# Patient Record
Sex: Female | Born: 1970 | Race: Black or African American | Hispanic: No | Smoking: Never smoker
Health system: Southern US, Community
[De-identification: ages and names within clinical notes are randomized; demographics above are authoritative.]

## PROBLEM LIST (undated history)

## (undated) DIAGNOSIS — I10 Essential (primary) hypertension: Secondary | ICD-10-CM

## (undated) DIAGNOSIS — E669 Obesity, unspecified: Secondary | ICD-10-CM

## (undated) DIAGNOSIS — M1990 Unspecified osteoarthritis, unspecified site: Secondary | ICD-10-CM

## (undated) HISTORY — DX: Unspecified osteoarthritis, unspecified site: M19.90

## (undated) HISTORY — DX: Obesity, unspecified: E66.9

## (undated) HISTORY — DX: Essential (primary) hypertension: I10

## (undated) HISTORY — PX: TUBAL LIGATION: SHX77

---

## 1997-10-09 ENCOUNTER — Encounter: Admission: RE | Admit: 1997-10-09 | Discharge: 1997-10-09 | Payer: Self-pay | Admitting: Sports Medicine

## 1998-01-31 ENCOUNTER — Encounter: Admission: RE | Admit: 1998-01-31 | Discharge: 1998-01-31 | Payer: Self-pay | Admitting: Family Medicine

## 1999-03-08 ENCOUNTER — Emergency Department (HOSPITAL_COMMUNITY): Admission: EM | Admit: 1999-03-08 | Discharge: 1999-03-08 | Payer: Self-pay | Admitting: Emergency Medicine

## 1999-03-29 ENCOUNTER — Emergency Department (HOSPITAL_COMMUNITY): Admission: EM | Admit: 1999-03-29 | Discharge: 1999-03-29 | Payer: Self-pay | Admitting: Emergency Medicine

## 1999-08-20 ENCOUNTER — Encounter: Payer: Self-pay | Admitting: Emergency Medicine

## 1999-08-20 ENCOUNTER — Emergency Department (HOSPITAL_COMMUNITY): Admission: EM | Admit: 1999-08-20 | Discharge: 1999-08-20 | Payer: Self-pay | Admitting: Emergency Medicine

## 2000-04-15 ENCOUNTER — Emergency Department (HOSPITAL_COMMUNITY): Admission: EM | Admit: 2000-04-15 | Discharge: 2000-04-15 | Payer: Self-pay | Admitting: Emergency Medicine

## 2000-10-07 ENCOUNTER — Emergency Department (HOSPITAL_COMMUNITY): Admission: EM | Admit: 2000-10-07 | Discharge: 2000-10-07 | Payer: Self-pay | Admitting: Emergency Medicine

## 2000-11-04 ENCOUNTER — Emergency Department (HOSPITAL_COMMUNITY): Admission: EM | Admit: 2000-11-04 | Discharge: 2000-11-04 | Payer: Self-pay | Admitting: Emergency Medicine

## 2000-12-24 ENCOUNTER — Encounter: Admission: RE | Admit: 2000-12-24 | Discharge: 2000-12-24 | Payer: Self-pay | Admitting: Family Medicine

## 2000-12-31 ENCOUNTER — Encounter: Admission: RE | Admit: 2000-12-31 | Discharge: 2000-12-31 | Payer: Self-pay | Admitting: Family Medicine

## 2001-01-31 ENCOUNTER — Encounter: Admission: RE | Admit: 2001-01-31 | Discharge: 2001-01-31 | Payer: Self-pay | Admitting: Family Medicine

## 2001-02-23 ENCOUNTER — Encounter: Admission: RE | Admit: 2001-02-23 | Discharge: 2001-02-23 | Payer: Self-pay | Admitting: Family Medicine

## 2003-12-14 ENCOUNTER — Emergency Department (HOSPITAL_COMMUNITY): Admission: EM | Admit: 2003-12-14 | Discharge: 2003-12-14 | Payer: Self-pay | Admitting: Family Medicine

## 2005-10-15 ENCOUNTER — Encounter: Admission: RE | Admit: 2005-10-15 | Discharge: 2005-10-15 | Payer: Self-pay | Admitting: Sports Medicine

## 2006-02-20 ENCOUNTER — Emergency Department (HOSPITAL_COMMUNITY): Admission: EM | Admit: 2006-02-20 | Discharge: 2006-02-20 | Payer: Self-pay | Admitting: Emergency Medicine

## 2006-03-06 ENCOUNTER — Encounter (INDEPENDENT_AMBULATORY_CARE_PROVIDER_SITE_OTHER): Payer: Self-pay | Admitting: *Deleted

## 2006-03-06 LAB — CONVERTED CEMR LAB

## 2006-03-12 ENCOUNTER — Other Ambulatory Visit: Admission: RE | Admit: 2006-03-12 | Discharge: 2006-03-12 | Payer: Self-pay | Admitting: Family Medicine

## 2006-03-12 ENCOUNTER — Ambulatory Visit: Payer: Self-pay | Admitting: Family Medicine

## 2006-03-12 ENCOUNTER — Encounter (INDEPENDENT_AMBULATORY_CARE_PROVIDER_SITE_OTHER): Payer: Self-pay | Admitting: *Deleted

## 2006-03-25 ENCOUNTER — Emergency Department (HOSPITAL_COMMUNITY): Admission: EM | Admit: 2006-03-25 | Discharge: 2006-03-25 | Payer: Self-pay | Admitting: Family Medicine

## 2006-03-29 ENCOUNTER — Emergency Department (HOSPITAL_COMMUNITY): Admission: EM | Admit: 2006-03-29 | Discharge: 2006-03-30 | Payer: Self-pay | Admitting: Emergency Medicine

## 2006-04-02 ENCOUNTER — Ambulatory Visit: Payer: Self-pay | Admitting: Family Medicine

## 2006-04-07 ENCOUNTER — Ambulatory Visit (HOSPITAL_COMMUNITY): Admission: RE | Admit: 2006-04-07 | Discharge: 2006-04-07 | Payer: Self-pay | Admitting: *Deleted

## 2006-04-08 ENCOUNTER — Ambulatory Visit: Payer: Self-pay | Admitting: Family Medicine

## 2006-07-06 ENCOUNTER — Emergency Department (HOSPITAL_COMMUNITY): Admission: EM | Admit: 2006-07-06 | Discharge: 2006-07-06 | Payer: Self-pay | Admitting: Emergency Medicine

## 2006-09-02 DIAGNOSIS — E669 Obesity, unspecified: Secondary | ICD-10-CM

## 2006-09-02 DIAGNOSIS — J309 Allergic rhinitis, unspecified: Secondary | ICD-10-CM | POA: Insufficient documentation

## 2006-09-03 ENCOUNTER — Encounter (INDEPENDENT_AMBULATORY_CARE_PROVIDER_SITE_OTHER): Payer: Self-pay | Admitting: *Deleted

## 2006-10-12 ENCOUNTER — Emergency Department (HOSPITAL_COMMUNITY): Admission: EM | Admit: 2006-10-12 | Discharge: 2006-10-12 | Payer: Self-pay | Admitting: Emergency Medicine

## 2006-12-08 ENCOUNTER — Emergency Department (HOSPITAL_COMMUNITY): Admission: EM | Admit: 2006-12-08 | Discharge: 2006-12-08 | Payer: Self-pay | Admitting: Family Medicine

## 2007-09-07 ENCOUNTER — Emergency Department (HOSPITAL_COMMUNITY): Admission: EM | Admit: 2007-09-07 | Discharge: 2007-09-07 | Payer: Self-pay | Admitting: Family Medicine

## 2007-10-22 ENCOUNTER — Emergency Department (HOSPITAL_COMMUNITY): Admission: EM | Admit: 2007-10-22 | Discharge: 2007-10-22 | Payer: Self-pay | Admitting: Emergency Medicine

## 2009-08-23 ENCOUNTER — Emergency Department (HOSPITAL_COMMUNITY): Admission: EM | Admit: 2009-08-23 | Discharge: 2009-08-23 | Payer: Self-pay | Admitting: Emergency Medicine

## 2010-03-12 ENCOUNTER — Emergency Department (HOSPITAL_COMMUNITY): Admission: EM | Admit: 2010-03-12 | Discharge: 2010-03-12 | Payer: Self-pay | Admitting: Emergency Medicine

## 2010-09-18 LAB — DIFFERENTIAL
Basophils Absolute: 0 10*3/uL (ref 0.0–0.1)
Eosinophils Absolute: 0.2 10*3/uL (ref 0.0–0.7)
Eosinophils Relative: 2 % (ref 0–5)
Monocytes Absolute: 0.6 10*3/uL (ref 0.1–1.0)

## 2010-09-18 LAB — BASIC METABOLIC PANEL
BUN: 14 mg/dL (ref 6–23)
CO2: 27 mEq/L (ref 19–32)
Chloride: 108 mEq/L (ref 96–112)
Creatinine, Ser: 1.01 mg/dL (ref 0.4–1.2)
Glucose, Bld: 90 mg/dL (ref 70–99)

## 2010-09-18 LAB — CBC
HCT: 40.1 % (ref 36.0–46.0)
MCH: 27.8 pg (ref 26.0–34.0)
MCHC: 33.4 g/dL (ref 30.0–36.0)
MCV: 83.2 fL (ref 78.0–100.0)
Platelets: 283 10*3/uL (ref 150–400)
RDW: 14.4 % (ref 11.5–15.5)

## 2010-09-18 LAB — D-DIMER, QUANTITATIVE: D-Dimer, Quant: <0.22 ug/mL-FEU (ref 0.00–0.48)

## 2011-04-02 ENCOUNTER — Emergency Department (HOSPITAL_COMMUNITY): Payer: Self-pay

## 2011-04-02 ENCOUNTER — Emergency Department (HOSPITAL_COMMUNITY)
Admission: EM | Admit: 2011-04-02 | Discharge: 2011-04-02 | Disposition: A | Discharge status: Home / Self Care | Payer: Self-pay | Attending: Emergency Medicine | Admitting: Emergency Medicine

## 2011-04-02 DIAGNOSIS — R42 Dizziness and giddiness: Secondary | ICD-10-CM | POA: Insufficient documentation

## 2011-04-02 DIAGNOSIS — M79609 Pain in unspecified limb: Secondary | ICD-10-CM | POA: Insufficient documentation

## 2011-04-02 DIAGNOSIS — R55 Syncope and collapse: Secondary | ICD-10-CM | POA: Insufficient documentation

## 2011-04-02 DIAGNOSIS — R110 Nausea: Secondary | ICD-10-CM | POA: Insufficient documentation

## 2011-04-02 LAB — COMPREHENSIVE METABOLIC PANEL
Albumin: 3.8 g/dL (ref 3.5–5.2)
BUN: 15 mg/dL (ref 6–23)
Creatinine, Ser: 0.71 mg/dL (ref 0.50–1.10)
Total Protein: 7.3 g/dL (ref 6.0–8.3)

## 2011-04-02 LAB — CBC
HCT: 39.8 % (ref 36.0–46.0)
MCHC: 33.2 g/dL (ref 30.0–36.0)
MCV: 81.7 fL (ref 78.0–100.0)
RDW: 14.5 % (ref 11.5–15.5)

## 2011-04-02 LAB — POCT I-STAT TROPONIN I

## 2013-04-12 ENCOUNTER — Encounter (HOSPITAL_COMMUNITY): Payer: Self-pay | Admitting: Emergency Medicine

## 2013-04-12 ENCOUNTER — Emergency Department (HOSPITAL_COMMUNITY): Payer: Self-pay

## 2013-04-12 ENCOUNTER — Emergency Department (HOSPITAL_COMMUNITY)
Admission: EM | Admit: 2013-04-12 | Discharge: 2013-04-12 | Disposition: A | Discharge status: Home / Self Care | Payer: Self-pay | Attending: Emergency Medicine | Admitting: Emergency Medicine

## 2013-04-12 DIAGNOSIS — J019 Acute sinusitis, unspecified: Secondary | ICD-10-CM | POA: Insufficient documentation

## 2013-04-12 DIAGNOSIS — G500 Trigeminal neuralgia: Secondary | ICD-10-CM | POA: Insufficient documentation

## 2013-04-12 DIAGNOSIS — J069 Acute upper respiratory infection, unspecified: Secondary | ICD-10-CM | POA: Insufficient documentation

## 2013-04-12 LAB — CBC WITH DIFFERENTIAL/PLATELET
Eosinophils Relative: 2 % (ref 0–5)
HCT: 40.1 % (ref 36.0–46.0)
Hemoglobin: 13.3 g/dL (ref 12.0–15.0)
Lymphocytes Relative: 28 % (ref 12–46)
Lymphs Abs: 2.7 10*3/uL (ref 0.7–4.0)
MCV: 83.4 fL (ref 78.0–100.0)
Monocytes Absolute: 0.7 10*3/uL (ref 0.1–1.0)
RBC: 4.81 MIL/uL (ref 3.87–5.11)
WBC: 9.5 10*3/uL (ref 4.0–10.5)

## 2013-04-12 LAB — COMPREHENSIVE METABOLIC PANEL
ALT: 12 U/L (ref 0–35)
CO2: 26 mEq/L (ref 19–32)
Calcium: 9.4 mg/dL (ref 8.4–10.5)
GFR calc Af Amer: 83 mL/min — ABNORMAL LOW (ref >90)
GFR calc non Af Amer: 71 mL/min — ABNORMAL LOW (ref >90)
Glucose, Bld: 78 mg/dL (ref 70–99)
Sodium: 143 mEq/L (ref 135–145)

## 2013-04-12 MED ORDER — ALBUTEROL SULFATE HFA 108 (90 BASE) MCG/ACT IN AERS: 1.0000 | INHALATION_SPRAY | Freq: Four times a day (QID) | RESPIRATORY_TRACT | Status: DC | PRN

## 2013-04-12 MED ORDER — ALBUTEROL SULFATE (5 MG/ML) 0.5% IN NEBU
5.0000 mg | INHALATION_SOLUTION | Freq: Once | RESPIRATORY_TRACT | Status: AC
  Administered 2013-04-12: 5 mg via RESPIRATORY_TRACT
  Filled 2013-04-12: qty 1

## 2013-04-12 MED ORDER — AZITHROMYCIN 250 MG PO TABS: 250.0000 mg | ORAL_TABLET | Freq: Every day | ORAL | Status: DC

## 2013-04-12 MED ORDER — ASPIRIN 81 MG PO CHEW
324.0000 mg | CHEWABLE_TABLET | Freq: Once | ORAL | Status: AC
  Administered 2013-04-12: 324 mg via ORAL
  Filled 2013-04-12: qty 4

## 2013-04-12 NOTE — ED Notes (Signed)
Per pt sts since yesterday she has sharp pains in her head. sts about 1 hour ago she began having SOB and lightheaded. Denies N,V.

## 2013-04-12 NOTE — ED Provider Notes (Signed)
CSN: [id number]     Arrival date & time 04/12/13  1337 History   First MD Initiated Contact with Patient 04/12/13 1612     Chief Complaint  Patient presents with  . Headache   (Consider location/radiation/quality/duration/timing/severity/associated sxs/prior Treatment) HPI Comments: Patient presents with a chief complaint of headache and shortness of breath.  She reports that the headache has been occurring intermittently over the past 1.5 weeks.  She describes the pain as a sharp shooting pain of the right side of her face.  She reports that the pain only last for a second and then resolves without intervention.  Patient denies any nausea, vomiting, vision changes, neck pain/stiffness, fever or chills.  She states that she has never had headaches like this before.  Denies headache at this time.    Patient also reports that she began feeling short of breath around noon today.  Shortness of breath associated with a dry cough.  She also reports that she has had some tightness in her chest.  She denies chest pain.  She reports that the tightness is across her chest and does not radiate.  She has also had some associated frontal sinus pressure for the past 2 weeks.  She denies any history of Asthma or COPD.  She does not smoke.   No history of PE or DVT.  No cardiac history.  No recent prolonged travel or surgeries in the past 4 weeks.  Denies lower extremity edema or pain.   She is currently not on any oral contraceptives.    The history is provided by the patient.    History reviewed. No pertinent past medical history. Past Surgical History  Procedure Laterality Date  . Tubal ligation     History reviewed. No pertinent family history. History  Substance Use Topics  . Smoking status: Never Smoker   . Smokeless tobacco: Not on file  . Alcohol Use: No   OB History   Grav Para Term Preterm Abortions TAB SAB Ect Mult Living                 Review of Systems  HENT: Positive for sinus  pressure.   Respiratory: Positive for chest tightness and shortness of breath.   Neurological: Positive for headaches.  All other systems reviewed and are negative.    Allergies  Review of patient's allergies indicates no known allergies.  Home Medications   Current Outpatient Rx  Name  Route  Sig  Dispense  Refill  . ibuprofen (ADVIL,MOTRIN) 200 MG tablet   Oral   Take 400 mg by mouth every 6 (six) hours as needed for pain.          BP 120/78  Pulse 57  Temp(Src) 97.9 F (36.6 C)  Resp 18  Ht 5\' 5"  (1.651 m)  Wt 313 lb (141.976 kg)  BMI 52.09 kg/m2  SpO2 100%  LMP 03/29/2013 Physical Exam  Nursing note and vitals reviewed. Constitutional: She is oriented to person, place, and time. She appears well-developed and well-nourished. No distress.  HENT:  Head: Normocephalic and atraumatic.  Nose: Right sinus exhibits frontal sinus tenderness. Left sinus exhibits frontal sinus tenderness.  Mouth/Throat: Oropharynx is clear and moist.  Eyes: EOM are normal. Pupils are equal, round, and reactive to light.  Neck: Normal range of motion. Neck supple.  Cardiovascular: Normal rate, regular rhythm and normal heart sounds.   Pulmonary/Chest: Effort normal and breath sounds normal. No respiratory distress. She has no wheezes. She has no rales.  Musculoskeletal: Normal range of motion.  Neurological: She is alert and oriented to person, place, and time. She has normal strength. No cranial nerve deficit or sensory deficit. She displays a negative Romberg sign. Coordination and gait normal.  Normal finger to nose testing Normal rapid alternating movements.  Skin: Skin is warm and dry. No rash noted. She is not diaphoretic.  Psychiatric: She has a normal mood and affect.    ED Course  Procedures (including critical care time) Labs Review Labs Reviewed  CBC WITH DIFFERENTIAL  COMPREHENSIVE METABOLIC PANEL   Imaging Review Dg Chest 2 View  04/12/2013   CLINICAL DATA:  Shortness  of breath.  EXAM: CHEST  2 VIEW  COMPARISON:  Multiple priors  FINDINGS: The cardiomediastinal silhouette is unchanged. No focal infiltrate or edema. No pleural effusion or pneumothorax. No acute osseous abnormality.  IMPRESSION: No active cardiopulmonary disease.   Electronically Signed   By: Jerene Dilling M.D.   On: 04/12/2013 17:40    6:08 PM Reassessed patient.  She reports that the SOB and chest tightness has improved after getting the Albuterol neb.  MDM  No diagnosis found. Patient presents today with two separate complaints.  She reports that she has had a headache and also SOB.  Headache most consistent with trigeminal neuralgia.  Patient with normal neurological exam.  No red flags of headache.  Patient also complaining of SOB.  No ischemic changes on EKG.  Troponin negative.  CXR negative.  Pulse ox 97-100 on RA.  No signs of respiratory distress.  Symptoms improved after given Albuterol neb.  Symptoms most consistent with Viral URI.  However, patient also with sinus pressure.  Will treat with antibiotic for sinus infection.  Patient is stable for discharge.  Return precautions given.    Pascal Lux [place], PA-C 04/13/13 1212

## 2013-04-12 NOTE — ED Notes (Signed)
Returned from radiology. 

## 2013-04-14 NOTE — ED Provider Notes (Signed)
Medical screening examination/treatment/procedure(s) were performed by non-physician practitioner and as supervising physician I was immediately available for consultation/collaboration.   Axxel Gude B. Marcelle Bebout, MD 04/14/13 2028 

## 2013-10-25 ENCOUNTER — Encounter (HOSPITAL_COMMUNITY): Payer: Self-pay | Admitting: Emergency Medicine

## 2013-10-25 ENCOUNTER — Emergency Department (HOSPITAL_COMMUNITY)
Admission: EM | Admit: 2013-10-25 | Discharge: 2013-10-25 | Disposition: A | Discharge status: Home / Self Care | Payer: Self-pay | Attending: Emergency Medicine | Admitting: Emergency Medicine

## 2013-10-25 ENCOUNTER — Emergency Department (HOSPITAL_COMMUNITY): Payer: Self-pay

## 2013-10-25 DIAGNOSIS — R000 Tachycardia, unspecified: Secondary | ICD-10-CM | POA: Insufficient documentation

## 2013-10-25 DIAGNOSIS — J329 Chronic sinusitis, unspecified: Secondary | ICD-10-CM

## 2013-10-25 DIAGNOSIS — J029 Acute pharyngitis, unspecified: Secondary | ICD-10-CM | POA: Insufficient documentation

## 2013-10-25 MED ORDER — AZITHROMYCIN 250 MG PO TABS: 250.0000 mg | ORAL_TABLET | Freq: Every day | ORAL | Status: DC

## 2013-10-25 MED ORDER — DEXTROMETHORPHAN POLISTIREX 30 MG/5ML PO LQCR: 30.0000 mg | ORAL | Status: DC | PRN

## 2013-10-25 NOTE — ED Provider Notes (Signed)
CSN: [id number]     Arrival date & time 10/25/13  8938 History  This chart was scribed for non-physician practitioner working with Diane Chen, by Allena Earing ED Scribe. This patient was seen in [facility] and the patient's care was started at 11:05 AM.    Chief Complaint  Patient presents with  . URI      Patient is a 43 y.o. female presenting with URI. The history is provided by the patient. No language interpreter was used.  URI Presenting symptoms: congestion, cough and sore throat    HPI Comments: Diane Chen is a 43 y.o. female who presents to the Emergency Department complaining of a sore that began on Sunday and has since worsened. Pt reports associated congestion, sinus pressure, cough, and fever. Her cough produces a thick,greenish sputum. The thickness of the sputum has the pt worried. She reports no h/o sinus problems.   History reviewed. No pertinent past medical history. Past Surgical History  Procedure Laterality Date  . Tubal ligation     History reviewed. No pertinent family history. History  Substance Use Topics  . Smoking status: Never Smoker   . Smokeless tobacco: Not on file  . Alcohol Use: No   OB History   Grav Para Term Preterm Abortions TAB SAB Ect Mult Living                 Review of Systems  HENT: Positive for congestion and sore throat.   Respiratory: Positive for cough.   All other systems reviewed and are negative.     Allergies  Review of patient's allergies indicates no known allergies.  Home Medications   Prior to Admission medications   Medication Sig Start Date End Date Taking? Authorizing Provider  albuterol (PROVENTIL HFA;VENTOLIN HFA) 108 (90 BASE) MCG/ACT inhaler Inhale 1-2 puffs into the lungs every 6 (six) hours as needed for wheezing. 04/12/13   Heather Laisure, PA-C  azithromycin (ZITHROMAX) 250 MG tablet Take 1 tablet (250 mg total) by mouth daily. Take first 2 tablets together, then 1 every day until  finished. 04/12/13   Heather Laisure, PA-C  ibuprofen (ADVIL,MOTRIN) 200 MG tablet Take 400 mg by mouth every 6 (six) hours as needed for pain.    Historical Provider, MD   BP 98/66  Pulse 117  Temp(Src) 97.8 F (36.6 C) (Oral)  Resp 16  Wt 305 lb 6 oz (138.517 kg)  SpO2 99% Physical Exam  Nursing note and vitals reviewed. Constitutional: She is oriented to person, place, and time. She appears well-developed and well-nourished. No distress.  HENT:  Head: Normocephalic and atraumatic.  Maxillary sinus tenderness to palpation  Eyes: Conjunctivae are normal. Pupils are equal, round, and reactive to light.  Neck: Normal range of motion.  Cardiovascular: Regular rhythm.  Exam reveals no gallop and no friction rub.   No murmur heard. tachycardic  Pulmonary/Chest: Effort normal and breath sounds normal. She has no wheezes. She has no rales. She exhibits no tenderness.  Abdominal: Soft. There is no tenderness.  Musculoskeletal: Normal range of motion.  Neurological: She is alert and oriented to person, place, and time.  Speech is goal-oriented. Moves limbs without ataxia.   Skin: Skin is warm and dry.    ED Course  Procedures (including critical care time)  DIAGNOSTIC STUDIES: Oxygen Saturation is 99% on RA, normalzxc by my interpretation.    COORDINATION OF CARE:   11:09 AM-Discussed treatment plan which includes antibiotics and cough medicationwith pt at bedside and pt agreed  to plan.   Labs Review Labs Reviewed - No data to display  Imaging Review Dg Chest 2 View  10/25/2013   CLINICAL DATA:  Cough, congestion, fever  EXAM: CHEST  2 VIEW  COMPARISON:  04/12/2013  FINDINGS: Cardiomediastinal silhouette is unremarkable. No acute infiltrate or pleural effusion. No pulmonary edema. Mild degenerative changes thoracic spine.  IMPRESSION: No active cardiopulmonary disease.   Electronically Signed   By: Lahoma Crocker M.D.   On: 10/25/2013 10:49     EKG Interpretation None      MDM    Final diagnoses:  Sinusitis   I personally performed the services described in this documentation, which was scribed in my presence. The recorded information has been reviewed and is accurate.  11:16 AM Chest xray unremarkable for acute changes. Patient will be treated for sinusitis with azithromycin. Patient will have delsym for cough. Vitals stable and patient afebrile. Patient tachycardic due to cough and congestion.     Diane Chou, PA-C 10/25/13 1118

## 2013-10-25 NOTE — ED Provider Notes (Signed)
Medical screening examination/treatment/procedure(s) were performed by non-physician practitioner and as supervising physician I was immediately available for consultation/collaboration.   EKG Interpretation None        Dhruva Orndoff M Redith Drach, DO 10/25/13 1914 

## 2013-10-25 NOTE — Discharge Instructions (Signed)
Take azithromycin as directed until gone. Refer to attached documents for more information. Take delsym as needed for cough. l

## 2013-10-25 NOTE — ED Notes (Signed)
She states "ive had a head and chest cold since Sunday." she c/o cough, congestion.

## 2013-10-25 NOTE — ED Notes (Signed)
States started with sore throat "burning" on Sunday. Developed nasal congestion and productive cough, green sputum. No exudate noted in throat.

## 2014-02-05 ENCOUNTER — Emergency Department (HOSPITAL_COMMUNITY): Payer: Self-pay

## 2014-02-05 ENCOUNTER — Emergency Department (HOSPITAL_COMMUNITY)
Admission: EM | Admit: 2014-02-05 | Discharge: 2014-02-06 | Disposition: A | Discharge status: Home / Self Care | Payer: Self-pay | Attending: Emergency Medicine | Admitting: Emergency Medicine

## 2014-02-05 ENCOUNTER — Encounter (HOSPITAL_COMMUNITY): Payer: Self-pay | Admitting: Emergency Medicine

## 2014-02-05 DIAGNOSIS — R109 Unspecified abdominal pain: Secondary | ICD-10-CM | POA: Insufficient documentation

## 2014-02-05 DIAGNOSIS — Z9851 Tubal ligation status: Secondary | ICD-10-CM | POA: Insufficient documentation

## 2014-02-05 DIAGNOSIS — D259 Leiomyoma of uterus, unspecified: Secondary | ICD-10-CM | POA: Insufficient documentation

## 2014-02-05 DIAGNOSIS — Z3202 Encounter for pregnancy test, result negative: Secondary | ICD-10-CM | POA: Insufficient documentation

## 2014-02-05 LAB — URINALYSIS, ROUTINE W REFLEX MICROSCOPIC
Glucose, UA: NEGATIVE mg/dL
Hgb urine dipstick: NEGATIVE
Ketones, ur: 15 mg/dL — AB
NITRITE: NEGATIVE
PH: 5 (ref 5.0–8.0)
Protein, ur: NEGATIVE mg/dL
SPECIFIC GRAVITY, URINE: 1.03 (ref 1.005–1.030)
UROBILINOGEN UA: 1 mg/dL (ref 0.0–1.0)

## 2014-02-05 LAB — CBC WITH DIFFERENTIAL/PLATELET
BASOS PCT: 0 % (ref 0–1)
Basophils Absolute: 0 10*3/uL (ref 0.0–0.1)
Eosinophils Absolute: 0.1 10*3/uL (ref 0.0–0.7)
Eosinophils Relative: 1 % (ref 0–5)
HEMATOCRIT: 38.8 % (ref 36.0–46.0)
HEMOGLOBIN: 12.7 g/dL (ref 12.0–15.0)
LYMPHS ABS: 2.7 10*3/uL (ref 0.7–4.0)
Lymphocytes Relative: 24 % (ref 12–46)
MCH: 27 pg (ref 26.0–34.0)
MCHC: 32.7 g/dL (ref 30.0–36.0)
MCV: 82.6 fL (ref 78.0–100.0)
MONO ABS: 0.9 10*3/uL (ref 0.1–1.0)
MONOS PCT: 8 % (ref 3–12)
NEUTROS ABS: 7.6 10*3/uL (ref 1.7–7.7)
Neutrophils Relative %: 67 % (ref 43–77)
Platelets: 228 10*3/uL (ref 150–400)
RBC: 4.7 MIL/uL (ref 3.87–5.11)
RDW: 15.1 % (ref 11.5–15.5)
WBC: 11.3 10*3/uL — ABNORMAL HIGH (ref 4.0–10.5)

## 2014-02-05 LAB — BASIC METABOLIC PANEL
Anion gap: 10 (ref 5–15)
BUN: 10 mg/dL (ref 6–23)
CHLORIDE: 107 meq/L (ref 96–112)
CO2: 23 mEq/L (ref 19–32)
CREATININE: 0.91 mg/dL (ref 0.50–1.10)
Calcium: 8.4 mg/dL (ref 8.4–10.5)
GFR calc non Af Amer: 76 mL/min — ABNORMAL LOW (ref >90)
GFR, EST AFRICAN AMERICAN: 88 mL/min — AB (ref >90)
GLUCOSE: 83 mg/dL (ref 70–99)
Potassium: 3.9 mEq/L (ref 3.7–5.3)
Sodium: 140 mEq/L (ref 137–147)

## 2014-02-05 LAB — URINE MICROSCOPIC-ADD ON

## 2014-02-05 LAB — WET PREP, GENITAL
Trich, Wet Prep: NONE SEEN
Yeast Wet Prep HPF POC: NONE SEEN

## 2014-02-05 LAB — POC URINE PREG, ED: PREG TEST UR: NEGATIVE

## 2014-02-05 MED ORDER — IOHEXOL 300 MG/ML  SOLN
100.0000 mL | Freq: Once | INTRAMUSCULAR | Status: AC | PRN
  Administered 2014-02-05: 100 mL via INTRAVENOUS

## 2014-02-05 MED ORDER — OXYCODONE-ACETAMINOPHEN 5-325 MG PO TABS
1.0000 | ORAL_TABLET | Freq: Once | ORAL | Status: AC
  Administered 2014-02-05: 1 via ORAL
  Filled 2014-02-05: qty 1

## 2014-02-05 NOTE — ED Notes (Signed)
Per pt sts onset of RLQ pain that started yesterday. Denies any N,V,D. Denies any vagina bleeding or discharge.

## 2014-02-05 NOTE — ED Provider Notes (Signed)
Place of right lower quadrant pain nonradiating for 2 days. No anorexia. Last bowel movement yesterday or today. Pain much improved after treatment with Percocet in the emergency department on exam no distress abdomen morbidly obese, minimally tender right lower quadrant no guarding rigidity or rebound  Diane Dakin, MD 02/05/14 2046

## 2014-02-05 NOTE — ED Notes (Signed)
CT notified pt completed drinking contrast 

## 2014-02-05 NOTE — ED Notes (Signed)
Pelvic cart set up at bedside- pt undressed from waist down.

## 2014-02-05 NOTE — ED Notes (Signed)
Patient transported to CT 

## 2014-02-05 NOTE — ED Notes (Signed)
CT called about delay

## 2014-02-05 NOTE — ED Provider Notes (Signed)
CSN: [id number]     Arrival date & time 02/05/14  1348 History   None    Chief Complaint  Patient presents with  . Abdominal Pain     (Consider location/radiation/quality/duration/timing/severity/associated sxs/prior Treatment) HPI Comments: Patient presents emergency department with chief complaint of right lower quadrant abdominal pain. She states pain started yesterday. She denies any associated nausea, vomiting, or diarrhea. She states the pain is worsened with palpation. She has not tried taking anything to alleviate her symptoms. She states that her pain as currently mild, but she was given Percocet by triage nurse. She reports mild intermittent dysuria, but states that this is improving. She denies any vaginal discharge or bleeding.  The history is provided by the patient. No language interpreter was used.    History reviewed. No pertinent past medical history. Past Surgical History  Procedure Laterality Date  . Tubal ligation     History reviewed. No pertinent family history. History  Substance Use Topics  . Smoking status: Never Smoker   . Smokeless tobacco: Not on file  . Alcohol Use: No   OB History   Grav Para Term Preterm Abortions TAB SAB Ect Mult Living                 Review of Systems  Constitutional: Negative for fever and chills.  Respiratory: Negative for shortness of breath.   Cardiovascular: Negative for chest pain.  Gastrointestinal: Positive for abdominal pain. Negative for nausea, vomiting, diarrhea and constipation.  Genitourinary: Negative for dysuria.  All other systems reviewed and are negative.     Allergies  Review of patient's allergies indicates no known allergies.  Home Medications   Prior to Admission medications   Medication Sig Start Date End Date Taking? Authorizing Provider  ibuprofen (ADVIL,MOTRIN) 200 MG tablet Take 400 mg by mouth every 6 (six) hours as needed for pain.   Yes Historical Provider, MD   BP 151/95  Pulse 69   Temp(Src) 98.5 F (36.9 C) (Oral)  Resp 18  Ht 5\' 5"  (1.651 m)  Wt 310 lb (140.615 kg)  BMI 51.59 kg/m2  SpO2 100%  LMP 01/22/2014 Physical Exam  Nursing note and vitals reviewed. Constitutional: She is oriented to person, place, and time. She appears well-developed and well-nourished.  HENT:  Head: Normocephalic and atraumatic.  Eyes: Conjunctivae and EOM are normal. Pupils are equal, round, and reactive to light.  Neck: Normal range of motion. Neck supple.  Cardiovascular: Normal rate and regular rhythm.  Exam reveals no gallop and no friction rub.   No murmur heard. Pulmonary/Chest: Effort normal and breath sounds normal. No respiratory distress. She has no wheezes. She has no rales. She exhibits no tenderness.  Abdominal: Soft. Bowel sounds are normal. She exhibits no distension and no mass. There is tenderness. There is no rebound and no guarding.  Right lower quadrant tenderness palpation, no other focal abdominal tenderness, no right upper quadrant tenderness, or Murphy's sign, no left-sided abdominal tenderness  Genitourinary:  Pelvic exam chaperoned by female ER tech, no right or left adnexal tenderness, no uterine tenderness, no vaginal discharge or bleeding, no CMT or friability, no foreign body, no injury to the external genitalia, no other significant findings   Musculoskeletal: Normal range of motion. She exhibits no edema and no tenderness.  Neurological: She is alert and oriented to person, place, and time.  Skin: Skin is warm and dry.  Psychiatric: She has a normal mood and affect. Her behavior is normal. Judgment and thought content  normal.    ED Course  Procedures (including critical care time) Results for orders placed during the hospital encounter of 02/05/14  WET PREP, GENITAL      Result Value Ref Range   Yeast Wet Prep HPF POC NONE SEEN  NONE SEEN   Trich, Wet Prep NONE SEEN  NONE SEEN   Clue Cells Wet Prep HPF POC FEW (*) NONE SEEN   WBC, Wet Prep HPF POC  MANY (*) NONE SEEN  URINALYSIS, ROUTINE W REFLEX MICROSCOPIC      Result Value Ref Range   Color, Urine AMBER (*) YELLOW   APPearance CLOUDY (*) CLEAR   Specific Gravity, Urine 1.030  1.005 - 1.030   pH 5.0  5.0 - 8.0   Glucose, UA NEGATIVE  NEGATIVE mg/dL   Hgb urine dipstick NEGATIVE  NEGATIVE   Bilirubin Urine SMALL (*) NEGATIVE   Ketones, ur 15 (*) NEGATIVE mg/dL   Protein, ur NEGATIVE  NEGATIVE mg/dL   Urobilinogen, UA 1.0  0.0 - 1.0 mg/dL   Nitrite NEGATIVE  NEGATIVE   Leukocytes, UA SMALL (*) NEGATIVE  URINE MICROSCOPIC-ADD ON      Result Value Ref Range   Squamous Epithelial / LPF MANY (*) RARE   WBC, UA 0-2  <3 WBC/hpf   RBC / HPF 0-2  <3 RBC/hpf   Bacteria, UA RARE  RARE  CBC WITH DIFFERENTIAL      Result Value Ref Range   WBC 11.3 (*) 4.0 - 10.5 K/uL   RBC 4.70  3.87 - 5.11 MIL/uL   Hemoglobin 12.7  12.0 - 15.0 g/dL   HCT 38.8  36.0 - 46.0 %   MCV 82.6  78.0 - 100.0 fL   MCH 27.0  26.0 - 34.0 pg   MCHC 32.7  30.0 - 36.0 g/dL   RDW 15.1  11.5 - 15.5 %   Platelets 228  150 - 400 K/uL   Neutrophils Relative % 67  43 - 77 %   Neutro Abs 7.6  1.7 - 7.7 K/uL   Lymphocytes Relative 24  12 - 46 %   Lymphs Abs 2.7  0.7 - 4.0 K/uL   Monocytes Relative 8  3 - 12 %   Monocytes Absolute 0.9  0.1 - 1.0 K/uL   Eosinophils Relative 1  0 - 5 %   Eosinophils Absolute 0.1  0.0 - 0.7 K/uL   Basophils Relative 0  0 - 1 %   Basophils Absolute 0.0  0.0 - 0.1 K/uL  BASIC METABOLIC PANEL      Result Value Ref Range   Sodium 140  137 - 147 mEq/L   Potassium 3.9  3.7 - 5.3 mEq/L   Chloride 107  96 - 112 mEq/L   CO2 23  19 - 32 mEq/L   Glucose, Bld 83  70 - 99 mg/dL   BUN 10  6 - 23 mg/dL   Creatinine, Ser 0.91  0.50 - 1.10 mg/dL   Calcium 8.4  8.4 - 10.5 mg/dL   GFR calc non Af Amer 76 (*) >90 mL/min   GFR calc Af Amer 88 (*) >90 mL/min   Anion gap 10  5 - 15  POC URINE PREG, ED      Result Value Ref Range   Preg Test, Ur NEGATIVE  NEGATIVE   Ct Abdomen Pelvis W  Contrast  02/06/2014   CLINICAL DATA:  Abdominal pain  EXAM: CT ABDOMEN AND PELVIS WITH CONTRAST  TECHNIQUE: Multidetector CT imaging of  the abdomen and pelvis was performed using the standard protocol following bolus administration of intravenous contrast.  CONTRAST:  168mL OMNIPAQUE IOHEXOL 300 MG/ML  SOLN  COMPARISON:  None.  FINDINGS: Lung bases are clear. Liver gallbladder and bile ducts are normal. Pancreas and spleen are normal. Kidneys are normal without obstruction mass or stone.  Negative for bowel obstruction or bowel thickening. Appendix not visualized. No signs of appendicitis are identified.  The uterus is markedly enlarged and heterogeneous. Multiple cystic and solid masses in the uterus. The uterus measures 15 x 12 x 15 cm. The uterus indents the dome of the bladder. Findings are most consistent with multiple uterine fibroids. These are not calcified.  No acute bony abnormality.  IMPRESSION: Fibroid uterus which is markedly enlarged.  Otherwise negative   Electronically Signed   By: Franchot Gallo M.D.   On: 02/06/2014 00:16     Imaging Review No results found.   EKG Interpretation None      MDM   Final diagnoses:  Uterine leiomyoma, unspecified location    Patient with right lower quadrant abdominal pain. Pelvic exam is unremarkable. No adnexal tenderness. No discharge. Will check CT scan.  CT is remarkable for fibroid uterus.  History with NSAIDs. Recommend OB/GYN followup. Patient understands and agrees to plan. She is stable and a for discharge.   Montine Circle, PA-C 02/06/14 0040

## 2014-02-06 LAB — GC/CHLAMYDIA PROBE AMP
CT PROBE, AMP APTIMA: NEGATIVE
GC PROBE AMP APTIMA: NEGATIVE

## 2014-02-06 MED ORDER — IBUPROFEN 800 MG PO TABS: 800.0000 mg | ORAL_TABLET | Freq: Three times a day (TID) | ORAL | Status: DC

## 2014-02-06 NOTE — ED Provider Notes (Signed)
Medical screening examination/treatment/procedure(s) were conducted as a shared visit with non-physician practitioner(s) and myself.  I personally evaluated the patient during the encounter.   EKG Interpretation None       Orlie Dakin, MD 02/06/14 3016

## 2014-02-06 NOTE — ED Notes (Signed)
R Browning, PA at bedside  

## 2014-02-06 NOTE — Discharge Instructions (Signed)
Fibroids Fibroids are lumps (tumors) that can occur any place in a woman's body. These lumps are not cancerous. Fibroids vary in size, weight, and where they grow. HOME CARE  Do not take aspirin.  Write down the number of pads or tampons you use during your period. Tell your doctor. This can help determine the best treatment for you. GET HELP RIGHT AWAY IF:  You have pain in your lower belly (abdomen) that is not helped with medicine.  You have cramps that are not helped with medicine.  You have more bleeding between or during your period.  You feel lightheaded or pass out (faint).  Your lower belly pain gets worse. MAKE SURE YOU:  Understand these instructions.  Will watch your condition.  Will get help right away if you are not doing well or get worse. Document Released: 07/25/2010 Document Revised: 09/14/2011 Document Reviewed: 07/25/2010 [employer] Patient Information 2015 [employer]. This information is not intended to replace advice given to you by your health care provider. Make sure you discuss any questions you have with your health care provider.  

## 2019-06-10 ENCOUNTER — Encounter (HOSPITAL_COMMUNITY): Payer: Self-pay

## 2019-06-10 ENCOUNTER — Other Ambulatory Visit: Payer: Self-pay

## 2019-06-10 ENCOUNTER — Ambulatory Visit (HOSPITAL_COMMUNITY)
Admission: EM | Admit: 2019-06-10 | Discharge: 2019-06-10 | Disposition: A | Discharge status: Home / Self Care | Payer: Self-pay | Attending: Family Medicine | Admitting: Family Medicine

## 2019-06-10 DIAGNOSIS — R030 Elevated blood-pressure reading, without diagnosis of hypertension: Secondary | ICD-10-CM

## 2019-06-10 DIAGNOSIS — J0100 Acute maxillary sinusitis, unspecified: Secondary | ICD-10-CM

## 2019-06-10 MED ORDER — FLUCONAZOLE 150 MG PO TABS: ORAL_TABLET | ORAL | 0 refills | Status: DC

## 2019-06-10 MED ORDER — AMOXICILLIN-POT CLAVULANATE 875-125 MG PO TABS: 1.0000 | ORAL_TABLET | Freq: Two times a day (BID) | ORAL | 0 refills | Status: DC

## 2019-06-10 NOTE — ED Triage Notes (Signed)
Pt states she has a headache and sinus pressure over her eyes. X 1 week

## 2019-06-10 NOTE — Discharge Instructions (Signed)
Your blood pressure was noted to be elevated during your visit today. You may return here within the next few days to recheck if unable to see your primary care doctor. If your blood pressure remains persistently elevated, you may need to begin taking a medication.  BP (!) 197/93 (BP Location: Right Wrist)    Pulse 68    Temp 98.2 F (36.8 C) (Oral)    Resp 20    Wt (!) 156.2 kg    LMP 06/01/2019    SpO2 98%    BMI 57.31 kg/m

## 2019-06-10 NOTE — ED Notes (Signed)
197/93 reported to [employer].the patient has been eating pork.

## 2019-06-10 NOTE — ED Provider Notes (Signed)
[place]   [id number] 06/10/19 Arrival Time: [id number]  ASSESSMENT & PLAN:  1. Acute non-recurrent maxillary sinusitis   2. Elevated blood pressure reading without diagnosis of hypertension     Begin: Meds ordered this encounter  Medications  . amoxicillin-clavulanate (AUGMENTIN) 875-125 MG tablet    Sig: Take 1 tablet by mouth every 12 (twelve) hours.    Dispense:  20 tablet    Refill:  0  . fluconazole (DIFLUCAN) 150 MG tablet    Sig: Take one tablet by mouth as a single dose. May repeat in 3 days if symptoms persist.    Dispense:  2 tablet    Refill:  0  Diflucan; pt request since she'll be on antibiotics.  Discussed typical duration of symptoms. OTC symptom care as needed. Ensure adequate fluid intake and rest.  Follow-up Information    [place].   Specialty: Urgent Care Why: As needed and to recheck your blood pressure when you're feeling better. Contact information: [place] [phone]           Discharge Instructions     Your blood pressure was noted to be elevated during your visit today. You may return here within the next few days to recheck if unable to see your primary care doctor. If your blood pressure remains persistently elevated, you may need to begin taking a medication.  BP (!) 197/93 (BP Location: Right Wrist)   Pulse 68   Temp 98.2 F (36.8 C) (Oral)   Resp 20   Wt (!) 156.2 kg   LMP 06/01/2019   SpO2 98%   BMI 57.31 kg/m       Reviewed expectations re: course of current medical issues. Questions answered. Outlined signs and symptoms indicating need for more acute intervention. Patient verbalized understanding. After Visit Summary given.   SUBJECTIVE: History from: patient.  Diane Chen is a 48 y.o. female who presents with complaint of nasal congestion, post-nasal drainage, and sinus pain. Onset gradual, at least one week ago; maybe  longer. Respiratory symptoms: none. Fever: absent. Overall normal PO intake without n/v. OTC treatment: decongestant without much relief. Seasonal allergies: no. History of frequent sinus infections: no. No specific aggravating or alleviating factors reported.  Social History   Tobacco Use  Smoking Status Never Smoker  Smokeless Tobacco Never Used   Increased blood pressure noted today. Reports that she has not been treated for hypertension in the past.  She reports no chest pain on exertion, no dyspnea on exertion, no swelling of ankles, no orthostatic dizziness or lightheadedness, no orthopnea or paroxysmal nocturnal dyspnea, no palpitations and no intermittent claudication symptoms.  ROS: As per HPI. All other systems negative.   OBJECTIVE:  Vitals:   06/10/19 1141 06/10/19 1144  BP:  (!) 197/93  Pulse:  68  Resp:  20  Temp:  98.2 F (36.8 C)  TempSrc:  Oral  SpO2:  98%  Weight: (!) 156.2 kg      General appearance: alert; no distress HEENT: nasal congestion; clear runny nose; throat irritation secondary to post-nasal drainage; bilateral maxillary tenderness to palpation; turbinates boggy Neck: supple without LAD; trachea midline CV: RRR Lungs: unlabored respirations, symmetrical air entry; cough: absent; no respiratory distress Ext: no edema Skin: warm and dry Neuro: normal gait Psychological: alert and cooperative; normal mood and affect  No Known Allergies  PMH: Allergic rhinitis  FH: Question of HTN  Social History   Socioeconomic  History  . Marital status: Legally Separated    Spouse name: Not on file  . Number of children: Not on file  . Years of education: Not on file  . Highest education level: Not on file  Occupational History  . Not on file  Social Needs  . Financial resource strain: Not on file  . Food insecurity    Worry: Not on file    Inability: Not on file  . Transportation needs    Medical: Not on file    Non-medical: Not on file   Tobacco Use  . Smoking status: Never Smoker  . Smokeless tobacco: Never Used  Substance and Sexual Activity  . Alcohol use: No  . Drug use: No  . Sexual activity: Not on file  Lifestyle  . Physical activity    Days per week: Not on file    Minutes per session: Not on file  . Stress: Not on file  Relationships  . Social Herbalist on phone: Not on file    Gets together: Not on file    Attends religious service: Not on file    Active member of club or organization: Not on file    Attends meetings of clubs or organizations: Not on file    Relationship status: Not on file  . Intimate partner violence    Fear of current or ex partner: Not on file    Emotionally abused: Not on file    Physically abused: Not on file    Forced sexual activity: Not on file  Other Topics Concern  . Not on file  Social History Narrative  . Not on file            Vanessa Kick, MD 06/12/19 [phone]

## 2019-08-04 ENCOUNTER — Encounter: Payer: Self-pay | Admitting: Family Medicine

## 2019-08-04 ENCOUNTER — Ambulatory Visit (INDEPENDENT_AMBULATORY_CARE_PROVIDER_SITE_OTHER): Payer: Self-pay | Admitting: Family Medicine

## 2019-08-04 ENCOUNTER — Telehealth: Payer: Self-pay | Admitting: Family Medicine

## 2019-08-04 ENCOUNTER — Other Ambulatory Visit: Payer: Self-pay

## 2019-08-04 VITALS — BP 142/92 | HR 70 | Wt 343.8 lb

## 2019-08-04 DIAGNOSIS — I10 Essential (primary) hypertension: Secondary | ICD-10-CM

## 2019-08-04 DIAGNOSIS — Z131 Encounter for screening for diabetes mellitus: Secondary | ICD-10-CM

## 2019-08-04 DIAGNOSIS — Z6841 Body Mass Index (BMI) 40.0 and over, adult: Secondary | ICD-10-CM

## 2019-08-04 LAB — POCT GLYCOSYLATED HEMOGLOBIN (HGB A1C): Hemoglobin A1C: 5.5 % (ref 4.0–5.6)

## 2019-08-04 NOTE — Patient Instructions (Addendum)
  Diet Recommendations for Diabetes   Starchy (carb) foods: Bread, rice, pasta, potatoes, corn, cereal, grits, crackers, bagels, muffins, all baked goods.  (Fruits, milk, and yogurt also have carbohydrate, but most of these foods will not spike your blood sugar as most starchy foods will.)  A few fruits do cause high blood sugars; use small portions of bananas (limit to 1/2 at a time), grapes, watermelon, oranges, and most tropical fruits.    Protein foods: Meat, fish, poultry, eggs, dairy foods, and beans such as pinto and kidney beans (beans also provide carbohydrate).   1. Eat at least 3 meals and 1-2 snacks per day. Never go more than 4-5 hours while awake without eating. Eat breakfast within the first hour of getting up.   2. Limit starchy foods to TWO per meal and ONE per snack. ONE portion of a starchy  food is equal to the following:   - ONE slice of bread (or its equivalent, such as half of a hamburger bun).   - 1/2 cup of a "scoopable" starchy food such as potatoes or rice.   - 15 grams of Total Carbohydrate as shown on food label.  3. Include at every meal: a protein food, a carb food, and vegetables and/or fruit.   - Obtain twice the volume of veg's as protein or carbohydrate foods for both lunch and dinner.   - Fresh or frozen veg's are best.   - Keep frozen veg's on hand for a quick vegetable serving.       Come and see me in 3-4 weeks time on 5 Feb at 4.25pm.  We will check your blood pressure and how you are getting on with your weight loss.  I look forward to meeting you then.  Best wishes and take care Dr. Posey Pronto

## 2019-08-04 NOTE — Telephone Encounter (Signed)
Called pt and left VM re A1c result.

## 2019-08-04 NOTE — Progress Notes (Cosign Needed)
   Subjective:    Patient ID: Diane Chen, female    DOB: March 22, 1971, 49 y.o.   MRN: [id number]   CC: Diane Chen is a 49 yr old female who presents for a new patient visit   HPI:  HTN  Pt takes HCTZ 20mg  once daily.Started a few months ago at previous clinic /urgent care when BP 99991111 systolic. Compliant with her medication. Tolerating well, denies side effects except diuretic effect. Denies chest pain, headaches or palpitations. Experiences little peripheral edema and reports blurred vision. She purchased glasses from the [employer] to help with this. Would like to come off HCZT in the future.  Weight  Pt would like to lose weight. Managed to lose 40lb last year by changing her diet to eating healthier,cutting back on fast food and exercising regularly. She regained this weight over the end of the last year due to the effects of COVID. She was unable to keep up with the healthy eating and exercising. Eats more fast food now. Diet over last 24 hrs: Yoghurt, orange, peanuts, taco salad from [employer], Doritos and sandwich. She gets cravings for sugar frequently and resorts to eating sugary foods and frequently feels tired. Is concerned about developing diabetes.  PMH: HTN  PSH: Tube ligation in 1992   DH: NKDA  FH: CVD-mother, father-HTN   SH: Lives with son, works in [employer], denies smoking or ETOH intake. Denies illicit drug use   Health care maintenance: Pap smear: due since 2007. Does not have insurance, would like to do pap smear once she can afford it.   Smoking status reviewed   ROS: pertinent noted in the HPI    Past medical history, surgical, family, and social history reviewed and updated in the EMR as appropriate. Reviewed problem list.   Objective:  BP (!) 142/92   Pulse 70   Wt (!) 343 lb 12.8 oz (155.9 kg)   LMP 07/03/2019   SpO2 99%   BMI 57.21 kg/m   Vitals and nursing note reviewed  General: NAD, pleasant, able to participate in exam,  obese female  Cardiac: RRR, S1 S2 present. normal heart sounds, no murmurs. Respiratory: CTAB, normal effort, No wheezes, rales or rhonchi Extremities: no edema or cyanosis. Skin: warm and dry, no rashes noted Neuro: alert, no obvious focal deficits Psych: Normal affect and mood   Assessment & Plan:    OBESITY, NOS BMI 57. Extensive diet and exercise counseling provided to the patient with concepts of the DM diet plan (provided on AVS). Also reiterated that weight loss will help with blood pressure control and reduce the risk of developing other co-morbidities.  -Recommended follow up in 3-4 weeks to discuss progress with weight loss.   Hypertension BP at goal today. Extensive diet and exercise counseling provided to patient which will help with blood pressure control and help to come off antihypertensive. -Continue HTCZ -F/u in 3-4 weeks for HTN with me   Lattie Haw, MD  [facility] PGY-1

## 2019-08-07 DIAGNOSIS — I10 Essential (primary) hypertension: Secondary | ICD-10-CM | POA: Insufficient documentation

## 2019-08-07 NOTE — Assessment & Plan Note (Signed)
BP at goal today. Extensive diet and exercise counseling provided to patient which will help with blood pressure control and help to come off antihypertensive. -Continue HTCZ -F/u in 3-4 weeks for HTN with me

## 2019-08-07 NOTE — Assessment & Plan Note (Signed)
BMI 57. Extensive diet and exercise counseling provided to the patient with concepts of the DM diet plan (provided on AVS). Also reiterated that weight loss will help with blood pressure control and reduce the risk of developing other co-morbidities.  -Recommended follow up in 3-4 weeks to discuss progress with weight loss.

## 2019-08-31 ENCOUNTER — Ambulatory Visit: Payer: Self-pay | Admitting: Family Medicine

## 2019-10-18 ENCOUNTER — Telehealth: Payer: Self-pay

## 2019-10-18 NOTE — Telephone Encounter (Signed)
Patient calls nurse line requesting refill on HCTZ 25mg  tablets. Patient reports taking one pill per day. Medication is not on current medication list. Patient scheduled f/u appointment on 11/16/19 with PCP. Please advise if medication can be filled until office visit on 11/16/19.   Talbot Grumbling, RN

## 2019-10-23 ENCOUNTER — Other Ambulatory Visit: Payer: Self-pay | Admitting: Family Medicine

## 2019-10-23 MED ORDER — HYDROCHLOROTHIAZIDE 25 MG PO TABS: 25.0000 mg | ORAL_TABLET | Freq: Every day | ORAL | 0 refills | Status: DC

## 2019-10-23 NOTE — Addendum Note (Signed)
Addended by: Talbot Grumbling on: 10/23/2019 04:06 PM   Modules accepted: Orders

## 2019-10-23 NOTE — Progress Notes (Signed)
Patient calls nurse line regarding blood pressure medication being sent to pharmacy. Refill was set to "print", verified with patient that she did not pick up paper rx. Rx resent under normal. Rx still transfers to paper rx. Called pharmacy and called in medication as written per Dr. Posey Pronto.   Talbot Grumbling, RN

## 2019-10-23 NOTE — Telephone Encounter (Signed)
I have sent in 30  tablets of HCTZ to her pharmacy and will review her BP next month. She has been taking this after being prescribed it from an ER for hypertension earlier this year. Thank you

## 2019-10-30 ENCOUNTER — Emergency Department (HOSPITAL_COMMUNITY)
Admission: EM | Admit: 2019-10-30 | Discharge: 2019-10-30 | Disposition: A | Discharge status: Home / Self Care | Payer: PRIVATE HEALTH INSURANCE | Attending: Emergency Medicine | Admitting: Emergency Medicine

## 2019-10-30 ENCOUNTER — Emergency Department (HOSPITAL_COMMUNITY): Payer: PRIVATE HEALTH INSURANCE

## 2019-10-30 ENCOUNTER — Other Ambulatory Visit: Payer: Self-pay

## 2019-10-30 ENCOUNTER — Encounter (HOSPITAL_COMMUNITY): Payer: Self-pay | Admitting: Emergency Medicine

## 2019-10-30 DIAGNOSIS — I10 Essential (primary) hypertension: Secondary | ICD-10-CM | POA: Insufficient documentation

## 2019-10-30 DIAGNOSIS — S40012A Contusion of left shoulder, initial encounter: Secondary | ICD-10-CM

## 2019-10-30 DIAGNOSIS — Y929 Unspecified place or not applicable: Secondary | ICD-10-CM | POA: Diagnosis not present

## 2019-10-30 DIAGNOSIS — Y999 Unspecified external cause status: Secondary | ICD-10-CM | POA: Insufficient documentation

## 2019-10-30 DIAGNOSIS — M25512 Pain in left shoulder: Secondary | ICD-10-CM

## 2019-10-30 DIAGNOSIS — Y939 Activity, unspecified: Secondary | ICD-10-CM | POA: Insufficient documentation

## 2019-10-30 DIAGNOSIS — S4992XA Unspecified injury of left shoulder and upper arm, initial encounter: Secondary | ICD-10-CM | POA: Diagnosis present

## 2019-10-30 DIAGNOSIS — W208XXA Other cause of strike by thrown, projected or falling object, initial encounter: Secondary | ICD-10-CM | POA: Diagnosis not present

## 2019-10-30 MED ORDER — IBUPROFEN 800 MG PO TABS: 800.0000 mg | ORAL_TABLET | Freq: Three times a day (TID) | ORAL | 0 refills | Status: DC | PRN

## 2019-10-30 MED ORDER — IBUPROFEN 800 MG PO TABS
800.0000 mg | ORAL_TABLET | Freq: Once | ORAL | Status: AC
  Administered 2019-10-30: 800 mg via ORAL
  Filled 2019-10-30: qty 1

## 2019-10-30 NOTE — ED Notes (Signed)
Patient transported to x-ray. ?

## 2019-10-30 NOTE — Discharge Instructions (Signed)
Return if any problems.

## 2019-10-30 NOTE — ED Triage Notes (Signed)
Pt arrives to ED from the gym with complaints of a glass door falling off its hinges as she's opening it and falling, striking her in the left shoulder. Patient states the glass did not break but she's had pain in her shoulder that has been worsening all day.

## 2019-10-30 NOTE — ED Notes (Signed)
Pt verbalizes discharge instruction understanding, left ED in NAD

## 2019-10-31 NOTE — ED Provider Notes (Signed)
[facility] EMERGENCY DEPARTMENT Provider Note   CSN: [id number] Arrival date & time: 10/30/19  1629     History Chief Complaint  Patient presents with  . Shoulder Pain    Diane Chen is a 49 y.o. female.  The history is provided by the patient. No language interpreter was used.  Shoulder Pain Location:  Shoulder Shoulder location:  L shoulder Injury: no   Pain details:    Quality:  Aching   Severity:  Moderate   Onset quality:  Gradual   Timing:  Constant   Progression:  Worsening Foreign body present:  No foreign bodies Relieved by:  Nothing Worsened by:  Nothing Ineffective treatments:  None tried Associated symptoms: no back pain and no neck pain   Pt reports a door fell off hinge and hit her shoulder. No impact of head     History reviewed. No pertinent past medical history.  Patient Active Problem List   Diagnosis Date Noted  . Hypertension 08/07/2019  . OBESITY, NOS 09/02/2006  . RHINITIS, ALLERGIC 09/02/2006    Past Surgical History:  Procedure Laterality Date  . TUBAL LIGATION       OB History   No obstetric history on file.     History reviewed. No pertinent family history.  Social History   Tobacco Use  . Smoking status: Never Smoker  . Smokeless tobacco: Never Used  Substance Use Topics  . Alcohol use: No  . Drug use: No    Home Medications Prior to Admission medications   Medication Sig Start Date End Date Taking? Authorizing Provider  hydrochlorothiazide (HYDRODIURIL) 25 MG tablet Take 1 tablet (25 mg total) by mouth daily. 10/23/19 11/22/19  Lattie Haw, MD  ibuprofen (ADVIL) 800 MG tablet Take 1 tablet (800 mg total) by mouth every 8 (eight) hours as needed. 10/30/19   Fransico Meadow, PA-C    Allergies    Patient has no known allergies.  Review of Systems   Review of Systems  Musculoskeletal: Positive for myalgias. Negative for back pain, joint swelling and neck pain.  All other systems reviewed and  are negative.   Physical Exam Updated Vital Signs BP (!) 133/95 (BP Location: Right Arm)   Pulse 73   Temp 98.1 F (36.7 C) (Oral)   Resp 18   SpO2 98%   Physical Exam Vitals and nursing note reviewed.  Constitutional:      Appearance: She is well-developed.  HENT:     Head: Normocephalic.  Cardiovascular:     Rate and Rhythm: Normal rate.  Pulmonary:     Effort: Pulmonary effort is normal.  Abdominal:     General: There is no distension.  Musculoskeletal:        General: Tenderness present. No swelling, deformity or signs of injury.     Cervical back: Normal range of motion.     Comments: Tender left shoulder, pain with movement, NV and ns intact   Skin:    General: Skin is warm.  Neurological:     Mental Status: She is alert and oriented to person, place, and time.  Psychiatric:        Mood and Affect: Mood normal.     ED Results / Procedures / Treatments   Labs (all labs ordered are listed, but only abnormal results are displayed) Labs Reviewed - No data to display  EKG None  Radiology DG Shoulder Left  Result Date: 10/30/2019 CLINICAL DATA:  ED pt said a door came  off it's top hinges and hit her left shoulder on the top. She said he shoulder is tender on the top. Normal ROM. EXAM: LEFT SHOULDER - 2+ VIEW COMPARISON:  None. FINDINGS: There is no evidence of fracture or dislocation. There is no evidence of arthropathy or other focal bone abnormality. Soft tissues are unremarkable. IMPRESSION: Negative radiographs of the left shoulder. Electronically Signed   By: Audie Pinto M.D.   On: 10/30/2019 18:22    Procedures Procedures (including critical care time)  Medications Ordered in ED Medications  ibuprofen (ADVIL) tablet 800 mg (800 mg Oral Given 10/30/19 1849)    ED Course  I have reviewed the triage vital signs and the nursing notes.  Pertinent labs & imaging results that were available during my care of the patient were reviewed by me and  considered in my medical decision making (see chart for details).    MDM Rules/Calculators/A&P                      MDM:  Xray no fracture,  Pt counseled on injury.  Pt placed in a sling  Pt advised to follow up with Family medicine for recheck in 1 week if pain persist Final Clinical Impression(s) / ED Diagnoses Final diagnoses:  Contusion of left shoulder, initial encounter  Acute pain of left shoulder    Rx / DC Orders ED Discharge Orders         Ordered    ibuprofen (ADVIL) 800 MG tablet  Every 8 hours PRN     10/30/19 1830        An After Visit Summary was printed and given to the patient.   Fransico Meadow, [place] 10/31/19 1553    Lajean Saver, MD 10/31/19 [phone]

## 2019-11-07 ENCOUNTER — Encounter: Payer: Self-pay | Admitting: Family Medicine

## 2019-11-07 ENCOUNTER — Ambulatory Visit (INDEPENDENT_AMBULATORY_CARE_PROVIDER_SITE_OTHER): Payer: Self-pay | Admitting: Family Medicine

## 2019-11-07 ENCOUNTER — Other Ambulatory Visit: Payer: Self-pay

## 2019-11-07 VITALS — BP 128/64 | HR 74 | Ht 65.0 in | Wt 347.2 lb

## 2019-11-07 DIAGNOSIS — M25512 Pain in left shoulder: Secondary | ICD-10-CM

## 2019-11-07 DIAGNOSIS — M62838 Other muscle spasm: Secondary | ICD-10-CM | POA: Insufficient documentation

## 2019-11-07 DIAGNOSIS — G8911 Acute pain due to trauma: Secondary | ICD-10-CM | POA: Insufficient documentation

## 2019-11-07 MED ORDER — METHYLPREDNISOLONE 4 MG PO TBPK: ORAL_TABLET | ORAL | 0 refills | Status: AC

## 2019-11-07 MED ORDER — NAPROXEN 500 MG PO TABS: 500.0000 mg | ORAL_TABLET | Freq: Two times a day (BID) | ORAL | 0 refills | Status: DC

## 2019-11-07 MED ORDER — LIDOCAINE 4 % EX PTCH: 10.0000 | MEDICATED_PATCH | Freq: Every day | CUTANEOUS | 0 refills | Status: DC

## 2019-11-07 NOTE — Patient Instructions (Addendum)
It was very nice to meet you today. Please enjoy the rest of your week. Today you were seen for left shoulder pain and back spasm. I have prescribed exercise handout, naproxen 500mg  twice daily and a steroid taper. Follow up in 2 weeks if not better.    Please call the clinic at [phone] if your symptoms worsen or you have any concerns. It was our pleasure to serve you.

## 2019-11-07 NOTE — Progress Notes (Signed)
    SUBJECTIVE:   CHIEF COMPLAINT / HPI:   ED visit follow up Diane Chen presents today after left shoulder trauma. She was hit in her posterior left shoulder 10/30/19 by a metal door when walking through [employer]. She did not suffer injury to her head or neck and did not fall. She was seen in the ED the same day and was given a sling and ibuprofen. She continues to endorse shoulder pain and middback pain/pressure more so with standing. She did not have this pain before the trauma. She is not used to being in pain and would like another modality to help with this.  Only other medication she takes is HCTZ for her blood pressure. She does not have any headache, changes in vision or chest pain at this time.  PERTINENT  PMH / PSH: Obesity, allergic rhinitis, tubal ligation  OBJECTIVE:   BP 128/64   Pulse 74   Ht 5\' 5"  (1.651 m)   Wt (!) 347 lb 4 oz (157.5 kg)   LMP 10/17/2019   SpO2 99%   BMI 57.79 kg/m    General: Appears to have discomfort, no acute distress. Age appropriate. Cardiac: RRR, normal heart sounds, no murmurs Respiratory: CTAB, normal effort MSK/Extremities: Lt. Posterior UE in sling is tender to palpation but with full ROM. Bilateral upper back musculature tender and hypertonic.  Skin: No bruising or lacerations on the Lt. UE or back area.  LEFT SHOULDER - 2+ VIEW4/26/2021 COMPARISON:  None. FINDINGS: There is no evidence of fracture or dislocation. There is no evidence of arthropathy or other focal bone abnormality. Soft tissues are unremarkable. IMPRESSION: Negative radiographs of the left shoulder.  ASSESSMENT/PLAN:   Acute pain of left shoulder due to trauma Acute. 1 week prior. Tender. No evidence fracture or lost of sensation. No bruising. Full ROM. Will need time to heal. Patient's pain is not well controlled with switch to naproxen for ease of administration and longer onset.  -continue sling as tolerated; encourage to move arm with  exercises below -Discontinue Ibuprofen 800mg  TID -Start Naproxen 500mg  BID PRN for pain -Will follow up with Dr. Posey Pronto 11/16/19  Muscle spasm Acute. Likely due to left shoulder trauma given new onset after the incident. Possible rhomboid muscle spasms; exercises given along with lidocaine patches. Patient desires to continue working through this and when discussed also preferred the option of a steroid taper.  -Rhomboid exercises given -Lidocaine patches daily PRN -Medrol dose pack take as directed -follow up Dr. Posey Pronto on 11/06/2019   Gerlene Fee, [place]   *Patient discussed with Dr. Sheppard Coil

## 2019-11-07 NOTE — Assessment & Plan Note (Signed)
Acute. Likely due to left shoulder trauma given new onset after the incident. Possible rhomboid muscle spasms; exercises given along with lidocaine patches. Patient desires to continue working through this and when discussed also preferred the option of a steroid taper.  -Rhomboid exercises given -Lidocaine patches daily PRN -Medrol dose pack take as directed -follow up Dr. Posey Pronto on 11/06/2019

## 2019-11-07 NOTE — Assessment & Plan Note (Signed)
Acute. 1 week prior. Tender. No evidence fracture or lost of sensation. No bruising. Full ROM. Will need time to heal. Patient's pain is not well controlled with switch to naproxen for ease of administration and longer onset.  -continue sling as tolerated; encourage to move arm with exercises below -Discontinue Ibuprofen 800mg  TID -Start Naproxen 500mg  BID PRN for pain -Will follow up with Dr. Posey Pronto 11/16/19

## 2019-11-13 ENCOUNTER — Emergency Department (HOSPITAL_COMMUNITY)
Admission: EM | Admit: 2019-11-13 | Discharge: 2019-11-13 | Disposition: A | Discharge status: Home / Self Care | Payer: Self-pay | Attending: Emergency Medicine | Admitting: Emergency Medicine

## 2019-11-13 ENCOUNTER — Encounter (HOSPITAL_COMMUNITY): Payer: Self-pay | Admitting: Emergency Medicine

## 2019-11-13 DIAGNOSIS — W208XXD Other cause of strike by thrown, projected or falling object, subsequent encounter: Secondary | ICD-10-CM | POA: Insufficient documentation

## 2019-11-13 DIAGNOSIS — M62830 Muscle spasm of back: Secondary | ICD-10-CM | POA: Insufficient documentation

## 2019-11-13 DIAGNOSIS — Z79899 Other long term (current) drug therapy: Secondary | ICD-10-CM | POA: Insufficient documentation

## 2019-11-13 DIAGNOSIS — I10 Essential (primary) hypertension: Secondary | ICD-10-CM | POA: Insufficient documentation

## 2019-11-13 DIAGNOSIS — G8929 Other chronic pain: Secondary | ICD-10-CM | POA: Insufficient documentation

## 2019-11-13 DIAGNOSIS — M25512 Pain in left shoulder: Secondary | ICD-10-CM | POA: Insufficient documentation

## 2019-11-13 MED ORDER — LIDOCAINE 5 % EX PTCH
1.0000 | MEDICATED_PATCH | CUTANEOUS | Status: DC
  Administered 2019-11-13: 1 via TRANSDERMAL
  Filled 2019-11-13: qty 1

## 2019-11-13 MED ORDER — CYCLOBENZAPRINE HCL 10 MG PO TABS: 10.0000 mg | ORAL_TABLET | Freq: Every day | ORAL | 0 refills | Status: DC

## 2019-11-13 NOTE — Discharge Instructions (Addendum)
Per our discussion, I would recommend that you continue taking Tylenol and ibuprofen.  I would take this every 6-8 hours.  I recommend about 400 mg of ibuprofen and 325 mg of Tylenol.  You can take these medications at the same time.  I am also prescribing you a new medication called Flexeril.  This is a strong muscle relaxant that also has a sedating effect.  I would take this once per night for your pain and difficulty sleeping.  Please do not mix this medication with alcohol.  Please do not operate a motor vehicle after taking this medication.  I would also recommend Voltaren gel.  You can buy this at your local pharmacy.  You can apply this to the affected regions as needed for pain.  I would recommend range of motion exercises of the left upper extremity as tolerated.  Please follow-up with your primary care provider regarding your symptoms and  this visit.  It was a pleasure to meet you.

## 2019-11-13 NOTE — ED Triage Notes (Signed)
Pt states she has had mid back pain after having a door fall onto her back on 10/30/19. Pt denies any radiation pain just stays in center of mid back. Pt denies any tingling or numbness.

## 2019-11-13 NOTE — ED Provider Notes (Signed)
[facility] EMERGENCY DEPARTMENT Provider Note   CSN: [id number] Arrival date & time: 11/13/19  1010     History Chief Complaint  Patient presents with  . Back Pain    Diane Chen is a 49 y.o. female.  HPI HPI Comments: Diane Chen is a 49 y.o. female who presents to the Emergency Department complaining of back and shoulder pain.  Patient states a swinging metal/glass door at a store came off the hinges and struck her in the left shoulder.  She was initially evaluated for this on April 26.  X-rays were obtained of the left shoulder and were negative at this time.  She was given a sling which she decided to no longer use because of the "stretching pain" in her shoulder and back.  She was given ibuprofen as well which she denies provides significant relief.  She was reevaluated by her primary care provider on May 4.  She was given prednisone, naproxen, lidocaine patches.  She did not fill the lidocaine patches due to their cost.  She denies significant relief with naproxen or prednisone.  She states her pain has persisted and now reports pain in the thoracic region.  Her pain worsens with movement of the left upper extremity.  She denies any numbness, tingling, weakness, chest pain, shortness of breath, abdominal pain, nausea, vomiting, diarrhea.     History reviewed. No pertinent past medical history.  Patient Active Problem List   Diagnosis Date Noted  . Muscle spasm 11/07/2019  . Acute pain of left shoulder due to trauma 11/07/2019  . Hypertension 08/07/2019  . OBESITY, NOS 09/02/2006  . RHINITIS, ALLERGIC 09/02/2006    Past Surgical History:  Procedure Laterality Date  . TUBAL LIGATION       OB History   No obstetric history on file.     No family history on file.  Social History   Tobacco Use  . Smoking status: Never Smoker  . Smokeless tobacco: Never Used  Substance Use Topics  . Alcohol use: No  . Drug use: No    Home  Medications Prior to Admission medications   Medication Sig Start Date End Date Taking? Authorizing Provider  hydrochlorothiazide (HYDRODIURIL) 25 MG tablet Take 1 tablet (25 mg total) by mouth daily. 10/23/19 11/22/19  Lattie Haw, MD  Lidocaine (HM LIDOCAINE PATCH) 4 % PTCH Apply 10 each topically daily. 11/07/19   Autry-Lott, Naaman Plummer, DO  methylPREDNISolone (MEDROL DOSEPAK) 4 MG TBPK tablet Use as directed 11/07/19 11/13/19  Autry-Lott, Naaman Plummer, DO  naproxen (NAPROSYN) 500 MG tablet Take 1 tablet (500 mg total) by mouth 2 (two) times daily with a meal. 11/07/19   Autry-Lott, Naaman Plummer, DO    Allergies    Patient has no known allergies.  Review of Systems   Review of Systems  All other systems reviewed and are negative. Ten systems reviewed and are negative for acute change, except as noted in the HPI.   Physical Exam Updated Vital Signs BP (!) 167/102 (BP Location: Right Wrist)   Pulse 77   Temp 98.4 F (36.9 C) (Oral)   Resp 16   LMP 10/17/2019   SpO2 100%   Physical Exam Vitals and nursing note reviewed.  Constitutional:      General: She is not in acute distress.    Appearance: Normal appearance. She is not ill-appearing, toxic-appearing or diaphoretic.  HENT:     Head: Normocephalic and atraumatic.     Right Ear: External ear normal.  Left Ear: External ear normal.     Nose: Nose normal.     Mouth/Throat:     Pharynx: Oropharynx is clear.  Eyes:     Extraocular Movements: Extraocular movements intact.  Cardiovascular:     Rate and Rhythm: Normal rate and regular rhythm.     Pulses: Normal pulses.     Heart sounds: Normal heart sounds. No murmur. No friction rub. No gallop.   Pulmonary:     Effort: Pulmonary effort is normal. No respiratory distress.     Breath sounds: Normal breath sounds. No stridor. No wheezing, rhonchi or rales.  Chest:     Chest wall: No tenderness.  Abdominal:     General: Abdomen is flat.     Palpations: Abdomen is soft.     Tenderness: There  is no abdominal tenderness.  Musculoskeletal:        General: Tenderness present. No deformity or signs of injury.     Cervical back: Normal range of motion and neck supple. No tenderness.     Right lower leg: No edema.     Left lower leg: No edema.     Comments: Moderate TTP noted to the left posterior shoulder along the musculature.  Moderate TTP noted to the paraspinal muscles bilaterally in the thoracic region.  No midline C, T, L-spine tenderness.  Patient has full active and passive range of motion of the bilateral upper extremities.  Distal sensation is intact.  Grip strength is 5 out of 5 bilaterally.  2+ radial pulses noted bilaterally.  Skin:    General: Skin is warm and dry.  Neurological:     General: No focal deficit present.     Mental Status: She is alert and oriented to person, place, and time.     Comments: Strength is 5 out of 5 in the bilateral lower extremities.  2+ patellar DTRs noted bilaterally.  Patient able to ambulate with a steady gait.  Psychiatric:        Mood and Affect: Mood normal.        Behavior: Behavior normal.    ED Results / Procedures / Treatments   Labs (all labs ordered are listed, but only abnormal results are displayed) Labs Reviewed - No data to display  EKG None  Radiology No results found.  Procedures Procedures (including critical care time)  Medications Ordered in ED Medications  lidocaine (LIDODERM) 5 % 1 patch (1 patch Transdermal Patch Applied 11/13/19 1212)    ED Course  I have reviewed the triage vital signs and the nursing notes.  Pertinent labs & imaging results that were available during my care of the patient were reviewed by me and considered in my medical decision making (see chart for details).    MDM Rules/Calculators/A&P                      Patient is a pleasant 49 year old female that presents with muscular pain along the left shoulder and thoracic paraspinal musculature.  Physical exam is reassuring.  She is  full range of motion of both extremities.  She is neurovascularly intact.  She has taken naproxen, ibuprofen, prednisone for symptoms and denies any significant relief.  She has been evaluated by her PCP as well as at another ED visit for this.   I recommended that she reach back out to her primary care provider regarding her symptoms and get reevaluation.  They might consider sports medicine referral.  I prescribed a short course  of Flexeril.  She understands that this is highly sedating and not to operate a motor vehicle or mix this medication with alcohol.  Also recommended Voltaren gel.  I recommend continued use of low doses of ibuprofen and Tylenol.  She was given a Lidoderm patch here in the emergency department.  She has been offered prescriptions for these in the past but has not filled them due to finances.  She understands if she has new or worsening symptoms she can come back to the emergency department for reevaluation.  Her questions were answered and she was amicable the time of discharge.  Her vital signs are stable.  Patient discharged to home/self care.  Condition at discharge: Stable  Note: Portions of this report may have been transcribed using voice recognition software. Every effort was made to ensure accuracy; however, inadvertent computerized transcription errors may be present.    Final Clinical Impression(s) / ED Diagnoses Final diagnoses:  Spasm of thoracic back muscle  Chronic left shoulder pain    Rx / DC Orders ED Discharge Orders         Ordered    cyclobenzaprine (FLEXERIL) 10 MG tablet  Daily at bedtime     11/13/19 1135           Rayna Sexton, PA-C 11/13/19 1319    Sherwood Gambler, MD 11/14/19 [phone]

## 2019-11-16 ENCOUNTER — Other Ambulatory Visit: Payer: Self-pay

## 2019-11-16 ENCOUNTER — Encounter: Payer: Self-pay | Admitting: Family Medicine

## 2019-11-16 ENCOUNTER — Ambulatory Visit (INDEPENDENT_AMBULATORY_CARE_PROVIDER_SITE_OTHER): Payer: Self-pay | Admitting: Family Medicine

## 2019-11-16 VITALS — BP 124/70 | HR 80 | Ht 65.0 in | Wt 343.0 lb

## 2019-11-16 DIAGNOSIS — M62838 Other muscle spasm: Secondary | ICD-10-CM

## 2019-11-16 MED ORDER — MELOXICAM 7.5 MG PO TABS: 7.5000 mg | ORAL_TABLET | Freq: Every day | ORAL | 0 refills | Status: AC

## 2019-11-16 NOTE — Patient Instructions (Signed)
Hi Diane Chen it was lovely to see you today! I am sorry that your shoulder pain is still a problem for you. Please stop taking the naproxen. I have prescribed meloxicam instead. Please take this with regular tylenol 4 times a day. You can also by volteren gel and apply to the area. I am referring you to physical therapy. Please do the exercises provided.  F/u with me in 1-2 weeks Best wishes,   Dr Posey Pronto

## 2019-11-19 NOTE — Progress Notes (Signed)
    SUBJECTIVE:   CHIEF COMPLAINT / HPI:   Back and shoulder pain Pt presents today for worsening left shoulder and upper back pain causing significant distress. She was hit in her posterior left shoulder 10/30/19 by a metal door when walking through [employer]. Pain is a throbbing pain starting surrounding the left shoulder joint and radiating to the upper back. Denies paraesthesia, neck pain or weakness. Xays in ED negative for fractures. She was seen in the clinic last week and prescribed steroid course, naproxen and lidociaine patch. Flexeril given to her by the ED has helped but makes her drowsy. Has had minimal effect on her pain. Is concerned she will not be able to work tomorrow as her leave ends tomorrow.   PERTINENT  PMH / PSH: HTN, shoulder injury   OBJECTIVE:   BP 124/70   Pulse 80   Ht 5\' 5"  (1.651 m)   Wt (!) 155.6 kg   LMP 11/16/2019   BMI 57.08 kg/m   General: Alert, moderate distress Cardio: warm and well perfused   Pulm: normal WOB  Extremities: No peripheral edema. Left UE in sling. Normal ROM in left arm, distal pulses in tact and cap refill < 2 secs. Tenderness on palpation of deltoid and upper back muscles. No c spine tenderness  Neuro: Cranial nerves grossly intact  ASSESSMENT/PLAN:   Muscle spasm Both shoulder and upper back pain likely related to muscle trauma. Fracture at glenohumeral joint ruled out by negative xrays.. Considered adhesive capsulitis however no specific pain on abduction and no painful arc. Considered dislocation however this is a subacute injury and no bony deformity at [facility] joint. Recommend: -Stop Naproxen -Start Meloxicam 7.5mg  once daily -Tylenol 650mg  Q6H -Amb referral to PT -Exercises given on handout. -Pt will contact me if she would like refill of Flexeril.  -Ice/heat therapy  -OTc Voltaren gel -F/u with me in 1-2 weeks.     Lattie Haw, MD [facility]

## 2019-11-19 NOTE — Assessment & Plan Note (Addendum)
Both shoulder and upper back pain likely related to muscle trauma. Fracture at glenohumeral joint ruled out by negative xrays.. Considered adhesive capsulitis however no specific pain on abduction and no painful arc. Considered dislocation however this is a subacute injury and no bony deformity at [facility] joint. Recommend: -Stop Naproxen -Start Meloxicam 7.5mg  once daily -Tylenol 650mg  Q6H -Amb referral to PT -Exercises given on handout. -Pt will contact me if she would like refill of Flexeril.  -Ice/heat therapy  -OTc Voltaren gel -F/u with me in 1-2 weeks.

## 2019-11-21 ENCOUNTER — Other Ambulatory Visit: Payer: Self-pay | Admitting: Family Medicine

## 2019-11-24 ENCOUNTER — Ambulatory Visit: Payer: Worker's Compensation | Admitting: Physical Therapy

## 2019-12-25 ENCOUNTER — Other Ambulatory Visit: Payer: Self-pay | Admitting: Family Medicine

## 2019-12-25 NOTE — Telephone Encounter (Signed)
Refilled for 1 month.

## 2019-12-29 ENCOUNTER — Other Ambulatory Visit: Payer: Self-pay | Admitting: Family Medicine

## 2020-03-15 ENCOUNTER — Other Ambulatory Visit: Payer: Self-pay

## 2020-03-15 ENCOUNTER — Ambulatory Visit (INDEPENDENT_AMBULATORY_CARE_PROVIDER_SITE_OTHER): Payer: 59 | Admitting: Family Medicine

## 2020-03-15 ENCOUNTER — Encounter: Payer: Self-pay | Admitting: Family Medicine

## 2020-03-15 VITALS — BP 140/82 | HR 78 | Wt 353.2 lb

## 2020-03-15 DIAGNOSIS — G8929 Other chronic pain: Secondary | ICD-10-CM | POA: Diagnosis not present

## 2020-03-15 DIAGNOSIS — M545 Low back pain, unspecified: Secondary | ICD-10-CM

## 2020-03-15 NOTE — Patient Instructions (Addendum)
Today you were seen for low back pain.  You would like a second opinion from a different orthopedic doctor.   We had you sign a records release form for the imaging of your lumbar spine from your prior orthopedic doctor. When available and we review we may decided to refer you to the neurologist.   Your blood pressure goal is <140/90. Continue taking your blood pressure medication.   Please call the clinic at [phone] if your symptoms worsen or you have any concerns. It was our pleasure to serve you.

## 2020-03-15 NOTE — Progress Notes (Signed)
    SUBJECTIVE:   CHIEF COMPLAINT / HPI:   Diane Chen is a 49 yo F who presents for a follow up visit for the below issue.   Back Pain Continues to experience lower back pain with standing and walking. Recent left leg heaviness feeling. This is continued injury from her incidence in April form having a door fall on her shoulder. She recently saw an orthopedic doctor and was told there is nothing to do for her back pain. 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   PERTINENT  PMH / PSH: Obesity, muscle spasms   OBJECTIVE:   BP 140/82   Pulse 78   Wt (!) 353 lb 3.2 oz (160.2 kg)   SpO2 98%   BMI 58.78 kg/m   General: Appears well, no acute distress. Age appropriate. MSK: Full ROM at hips bilaterally. Pain with palpation over PSIS bilaterally. Gait is normal Diane Minium, MD - 01/09/2020  Formatting of this note might be different from the original. TECHNIQUE: 5 views lumbar spine.  HISTORY: Lumbar spine pain.  FINDINGS: No fracture or subluxation identified. Mild degenerative changes, most evident at L4-5. Mild dextroscoliosis lumbar spine.   IMPRESSION: No fracture or subluxation identified. Mild degenerative changes and scoliosis.  Electronically Signed by: Diane Chen     ASSESSMENT/PLAN:   Chronic low back pain Chronic. Shoulder injury from April. Seeking workers compensation. Recent visit with orthopedic surgery and was found to have mild degenerative changes and mild scoliosis of the lumbar spine. Would like a second opinion.  -Referral to orthopedic surgery   Diane Chen, Diane Chen

## 2020-03-20 DIAGNOSIS — G8929 Other chronic pain: Secondary | ICD-10-CM | POA: Insufficient documentation

## 2020-03-20 NOTE — Assessment & Plan Note (Signed)
Chronic. Shoulder injury from April. Seeking workers compensation. Recent visit with orthopedic surgery and was found to have mild degenerative changes and mild scoliosis of the lumbar spine. Would like a second opinion.  -Referral to orthopedic surgery

## 2020-03-27 ENCOUNTER — Telehealth: Payer: Self-pay | Admitting: *Deleted

## 2020-03-27 NOTE — Telephone Encounter (Signed)
Spoke with patient this afternoon. She voiced understanding that the orthopedics may get advanced imaging at her visit if indicated. She is scheduled 04/06/2020.

## 2020-03-27 NOTE — Telephone Encounter (Signed)
Received voicemail from patient after discussing with her that MRI wasn't needed at this time yesterday.  She called back wanting to discuss this further with the provider as she is really wanting to get this done prior to ortho appt.  Referral has been sent to ortho but I have not heard back yet about an appointment.  Will send to MD to discuss with patient why mri is not appropriate at this time.  Also the insurance may not cover it since provider does not deem it medically necessary.  Dimitris Shanahan,CMA

## 2020-04-01 ENCOUNTER — Other Ambulatory Visit: Payer: Self-pay | Admitting: Family Medicine

## 2020-04-12 ENCOUNTER — Other Ambulatory Visit: Payer: Self-pay | Admitting: Physical Medicine & Rehabilitation

## 2020-04-12 DIAGNOSIS — M545 Low back pain, unspecified: Secondary | ICD-10-CM

## 2020-05-04 ENCOUNTER — Other Ambulatory Visit: Payer: Self-pay

## 2020-05-04 ENCOUNTER — Ambulatory Visit
Admission: RE | Admit: 2020-05-04 | Discharge: 2020-05-04 | Disposition: A | Discharge status: Home / Self Care | Payer: 59 | Source: Ambulatory Visit | Attending: Physical Medicine & Rehabilitation | Admitting: Physical Medicine & Rehabilitation

## 2020-05-04 DIAGNOSIS — M545 Low back pain, unspecified: Secondary | ICD-10-CM

## 2020-08-26 ENCOUNTER — Other Ambulatory Visit: Payer: Self-pay | Admitting: Family Medicine

## 2021-02-01 ENCOUNTER — Other Ambulatory Visit: Payer: Self-pay | Admitting: Family Medicine

## 2021-02-23 ENCOUNTER — Encounter (HOSPITAL_COMMUNITY): Payer: Self-pay | Admitting: *Deleted

## 2021-02-23 ENCOUNTER — Other Ambulatory Visit: Payer: Self-pay

## 2021-02-23 ENCOUNTER — Emergency Department (HOSPITAL_COMMUNITY)
Admission: EM | Admit: 2021-02-23 | Discharge: 2021-02-23 | Disposition: A | Discharge status: Home / Self Care | Payer: 59 | Attending: Emergency Medicine | Admitting: Emergency Medicine

## 2021-02-23 DIAGNOSIS — K047 Periapical abscess without sinus: Secondary | ICD-10-CM | POA: Insufficient documentation

## 2021-02-23 DIAGNOSIS — Z79899 Other long term (current) drug therapy: Secondary | ICD-10-CM | POA: Diagnosis not present

## 2021-02-23 DIAGNOSIS — K0889 Other specified disorders of teeth and supporting structures: Secondary | ICD-10-CM | POA: Diagnosis present

## 2021-02-23 DIAGNOSIS — I10 Essential (primary) hypertension: Secondary | ICD-10-CM | POA: Diagnosis not present

## 2021-02-23 HISTORY — DX: Essential (primary) hypertension: I10

## 2021-02-23 MED ORDER — AMOXICILLIN-POT CLAVULANATE 875-125 MG PO TABS
1.0000 | ORAL_TABLET | Freq: Once | ORAL | Status: AC
  Administered 2021-02-23: 1 via ORAL
  Filled 2021-02-23: qty 1

## 2021-02-23 MED ORDER — KETOROLAC TROMETHAMINE 30 MG/ML IJ SOLN
30.0000 mg | Freq: Once | INTRAMUSCULAR | Status: AC
  Administered 2021-02-23: 30 mg via INTRAMUSCULAR
  Filled 2021-02-23: qty 1

## 2021-02-23 MED ORDER — AMOXICILLIN-POT CLAVULANATE 875-125 MG PO TABS: 1.0000 | ORAL_TABLET | Freq: Two times a day (BID) | ORAL | 0 refills | Status: DC

## 2021-02-23 NOTE — ED Provider Notes (Signed)
[facility] EMERGENCY DEPARTMENT Provider Note   CSN: [id number] Arrival date & time: 02/23/21  2037     History Chief Complaint  Patient presents with   Dental Pain    Diane Chen is a 50 y.o. female.   Dental Pain Associated symptoms: no drooling and no fever    Patient presents with left lower jaw dental pain.  States that she chipped a tooth over 6 months ago, has been unable to follow-up with a dentist due to not having insurance.  States the pain has been very manageable with meloxicam which she is currently taking for back pain.  However, starting yesterday she had severe onset of pain to the left lower jaw.  She denies any associated fever, drainage but notes that there has been increased swelling.  She denies any abnormal taste.  .  Past Medical History:  Diagnosis Date   Hypertension     Patient Active Problem List   Diagnosis Date Noted   Chronic low back pain 03/20/2020   Muscle spasm 11/07/2019   Acute pain of left shoulder due to trauma 11/07/2019   Hypertension 08/07/2019   OBESITY, NOS 09/02/2006   RHINITIS, ALLERGIC 09/02/2006    Past Surgical History:  Procedure Laterality Date   TUBAL LIGATION       OB History   No obstetric history on file.     No family history on file.  Social History   Tobacco Use   Smoking status: Never   Smokeless tobacco: Never  Substance Use Topics   Alcohol use: No   Drug use: No    Home Medications Prior to Admission medications   Medication Sig Start Date End Date Taking? Authorizing Provider  amoxicillin-clavulanate (AUGMENTIN) 875-125 MG tablet Take 1 tablet by mouth every 12 (twelve) hours. 02/23/21  Yes Sherrill Raring, PA-C  cyclobenzaprine (FLEXERIL) 10 MG tablet TAKE ONE TABLET('10MG'$  TOTAL) BY MOUTH AT BEDTIME 11/22/19   Lattie Haw, MD  hydrochlorothiazide (HYDRODIURIL) 25 MG tablet TAKE 1 TABLET BY MOUTH EVERY DAY 02/03/21 03/05/21  Lattie Haw, MD  meloxicam (MOBIC) 7.5 MG tablet  Take 1 tablet (7.5 mg total) by mouth daily. 11/16/19   Lattie Haw, MD    Allergies    Patient has no known allergies.  Review of Systems   Review of Systems  Constitutional:  Negative for fever.  HENT:  Positive for dental problem. Negative for drooling.    Physical Exam Updated Vital Signs BP (!) 170/99 (BP Location: Left Wrist)   Pulse (!) 101   Temp 98 F (36.7 C)   Resp 17   Ht '5\' 5"'$  (1.651 m)   Wt (!) 165.6 kg   LMP 01/19/2021 (Approximate)   SpO2 99%   BMI 60.74 kg/m   Physical Exam Vitals and nursing note reviewed. Exam conducted with a chaperone present.  Constitutional:      General: She is not in acute distress.    Appearance: Normal appearance.  HENT:     Head: Normocephalic and atraumatic.     Mouth/Throat:     Mouth: Mucous membranes are moist.     Pharynx: No oropharyngeal exudate.     Comments: Tooth 21 is chipped, dental pulp and floor not visible.  There is a collection of purulent material and surrounding fluctuance.  No sublingual tenderness or swelling.  Uvula is midline. Eyes:     General: No scleral icterus.    Extraocular Movements: Extraocular movements intact.     Pupils: Pupils are  equal, round, and reactive to light.  Neck:     Comments: No adenopathy.  No swelling to the neck. Musculoskeletal:     Cervical back: Normal range of motion. No tenderness.  Skin:    Coloration: Skin is not jaundiced.  Neurological:     Mental Status: She is alert. Mental status is at baseline.     Coordination: Coordination normal.    ED Results / Procedures / Treatments   Labs (all labs ordered are listed, but only abnormal results are displayed) Labs Reviewed - No data to display  EKG None  Radiology No results found.  Procedures Procedures   Medications Ordered in ED Medications  ketorolac (TORADOL) 30 MG/ML injection 30 mg (has no administration in time range)  amoxicillin-clavulanate (AUGMENTIN) 875-125 MG per tablet 1 tablet (has no  administration in time range)    ED Course  I have reviewed the triage vital signs and the nursing notes.  Pertinent labs & imaging results that were available during my care of the patient were reviewed by me and considered in my medical decision making (see chart for details).    MDM Rules/Calculators/A&P                           Final recheck, patient's heart rate is 91.  She is not febrile.  Patient has dental abscess, no sublingual tenderness suggestive of [place] angina.  Uvula is midline, lower suspicion for peritonsillar abscess.  Given that she is not febrile or tachycardic, I think it is appropriate to treat the dental abscess with antibiotics and dental follow-up.  Given patient multiple options given that she has had some issues with cost in the past.   Final Clinical Impression(s) / ED Diagnoses Final diagnoses:  Pain, dental    Rx / DC Orders ED Discharge Orders          Ordered    amoxicillin-clavulanate (AUGMENTIN) 875-125 MG tablet  Every 12 hours        02/23/21 2123             Sherrill Raring, PA-C 02/23/21 2142    Valarie Merino, MD 02/24/21 2250

## 2021-02-23 NOTE — ED Notes (Signed)
Patient given discharge instructions, all questions answered. Patient in possession of all belongings, directed to the discharge area  

## 2021-02-23 NOTE — Discharge Instructions (Addendum)
Starting tomorrow morning, take Augmentin twice daily.  This is an antibiotic that will help treat the infection in your mouth.  Please call the dental referral office first thing tomorrow morning and see if they will see you, you were seen in the ED so I think that they should have a contract with [place] that they will see you in the office.  I would just verify to make sure that is still the policy.  Here are a few different facilities to try.  I would try calling and asking if they do a sliding scale or if they would accept a flat rate.  Sometimes there is some flexibility to make it more cost affordable.   If things worsen, if you start having difficulty swallowing, significant swelling, increasing size of the abscess, fever or worsening symptoms please return back to the ED.

## 2021-02-23 NOTE — ED Triage Notes (Signed)
Pt states L lower dental pain and swelling.

## 2021-03-14 ENCOUNTER — Other Ambulatory Visit: Payer: Self-pay | Admitting: Neurosurgery

## 2021-04-14 ENCOUNTER — Inpatient Hospital Stay: Admit: 2021-04-14 | Payer: 59 | Admitting: Neurosurgery

## 2021-04-14 SURGERY — POSTERIOR LUMBAR FUSION 2 LEVEL
Anesthesia: General

## 2021-04-17 IMAGING — MR MR LUMBAR SPINE W/O CM
4 of 5 series · 18 of 48 positions shown · non-contrast
Comparison: None.

CLINICAL DATA: Lower back.

EXAM:
MRI LUMBAR SPINE WITHOUT CONTRAST
TECHNIQUE: Multiplanar, multisequence MR imaging of the lumbar spine was
performed. No intravenous contrast was administered.

[Series 6: T2 · sagittal · 4.0mm · 0.73mm/px · 5 of 15 slices shown (1 of 2)]
[im 1/15]
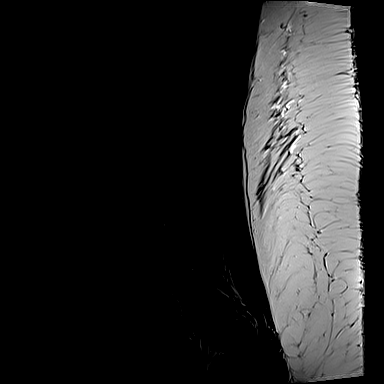
[im 4/15]
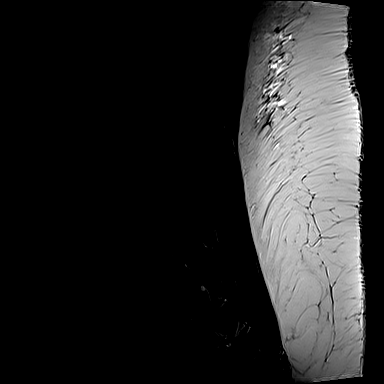
[im 8/15]
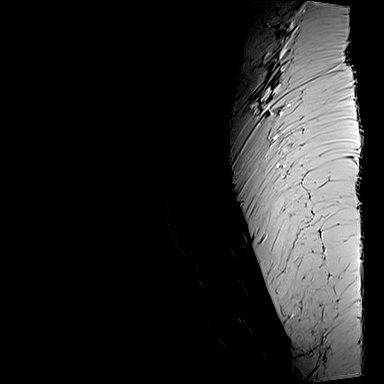
[im 11/15]
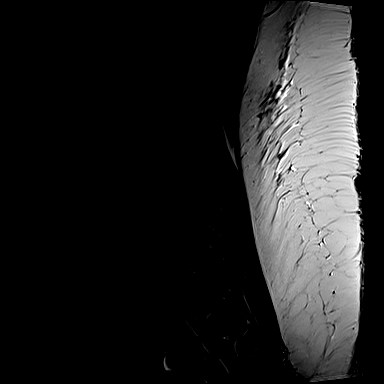
[im 15/15]
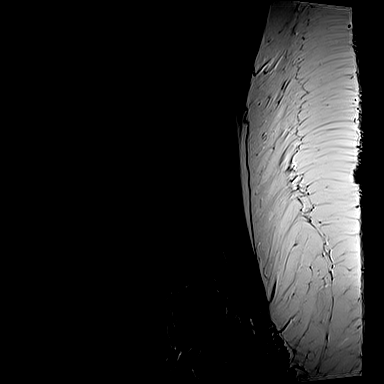

[Series 7: T1 · sagittal · 4.0mm · 0.73mm/px · 3 of 15 slices shown (1 of 2)]
[im 1/15]
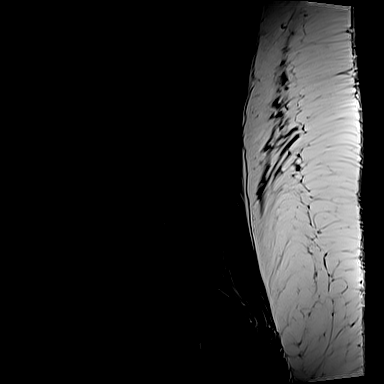
[im 8/15]
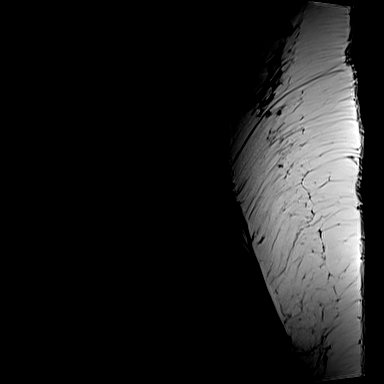
[im 15/15]
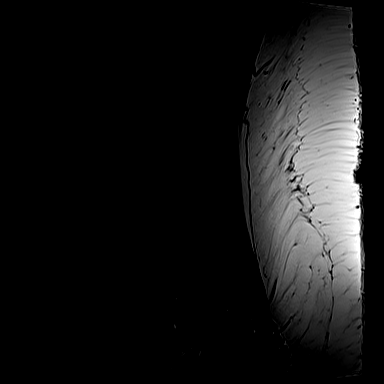

[Series 11: T1 · axial · 4.0mm · 0.28mm/px · z∈[+53,+216]mm · 3 of 42 slices shown (2 of 2)]
[im 6/42]
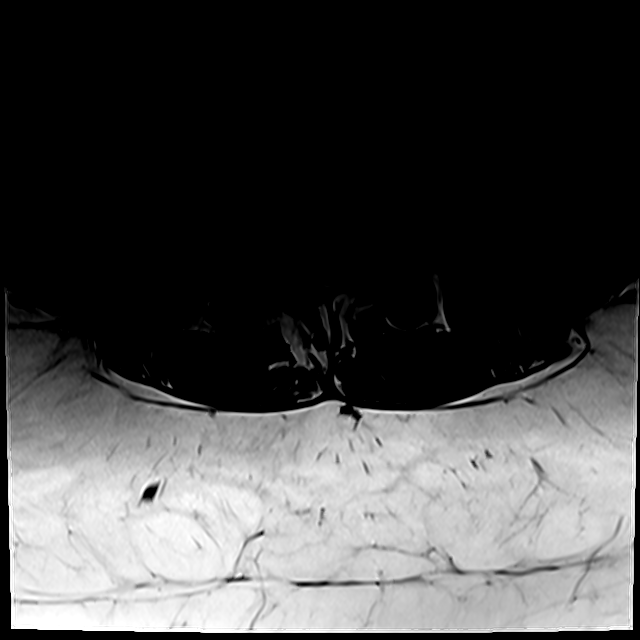
[im 22/42]
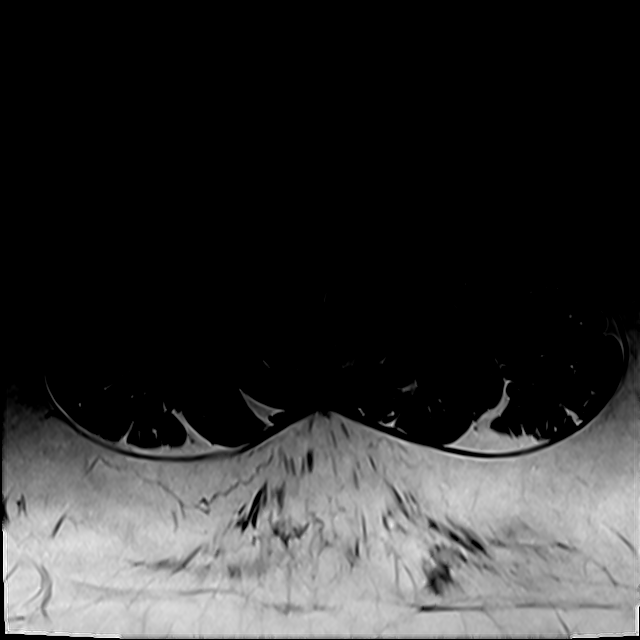
[im 36/42]
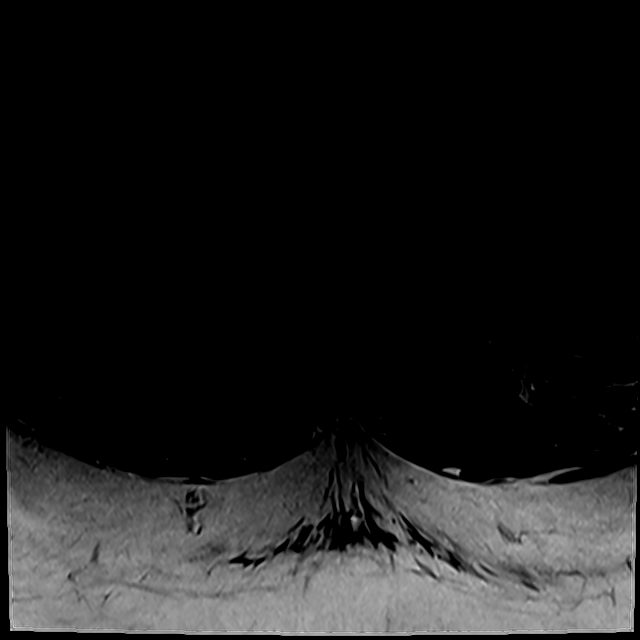

[Series 14: T2 · axial · 4.0mm · 0.28mm/px · z∈[+38,+216]mm · 7 of 42 slices shown (2 of 2)]
[im 3/42]
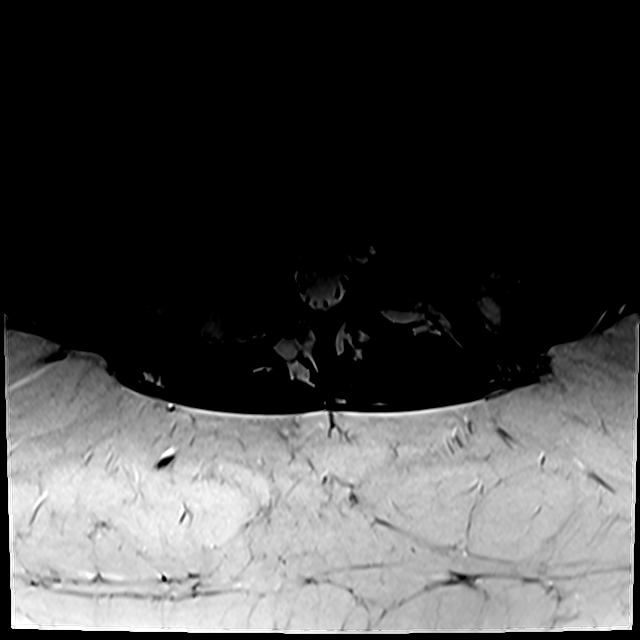
[im 6/42]
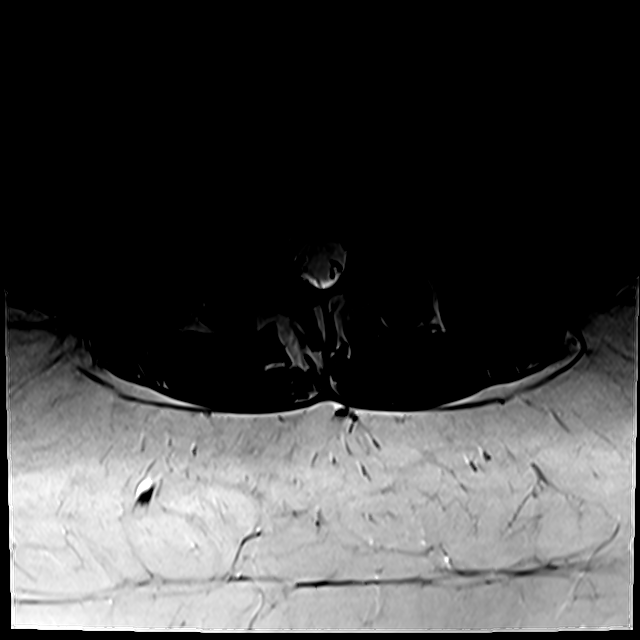
[im 9/42]
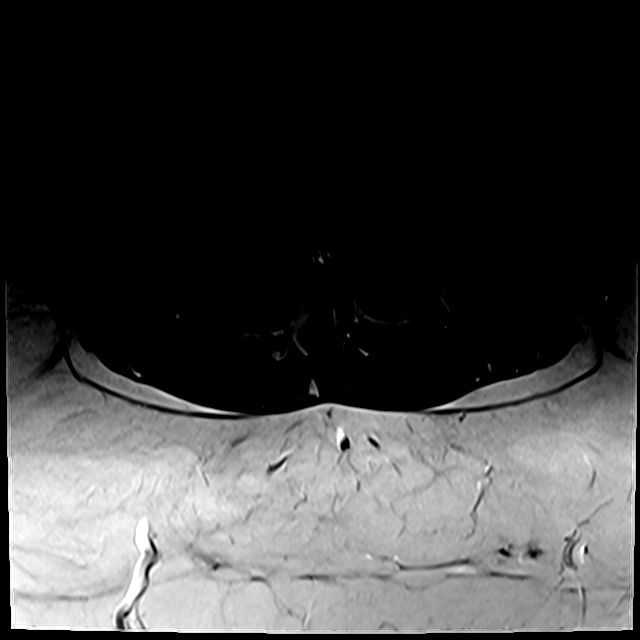
[im 14/42]
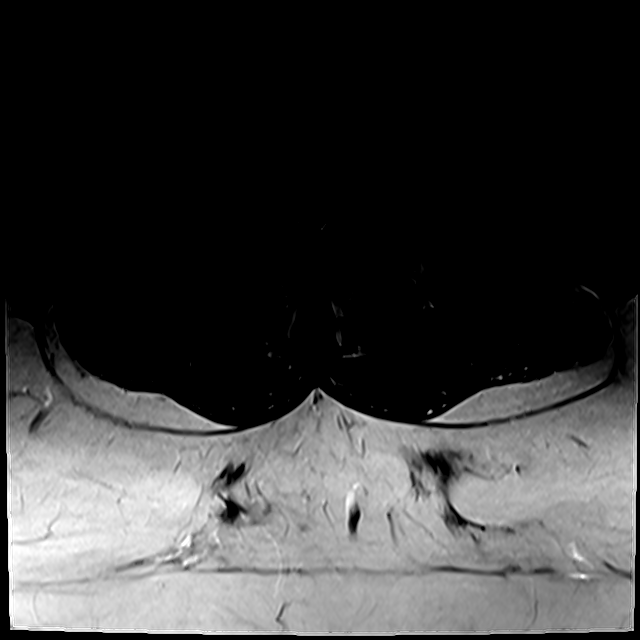
[im 20/42]
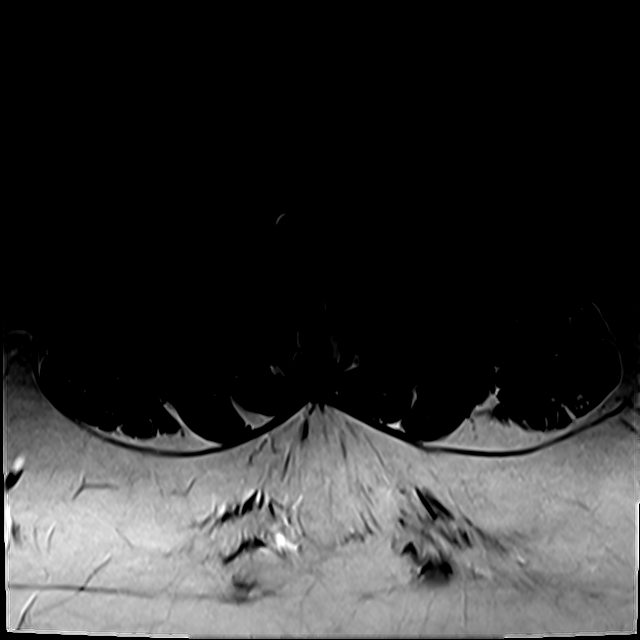
[im 22/42]
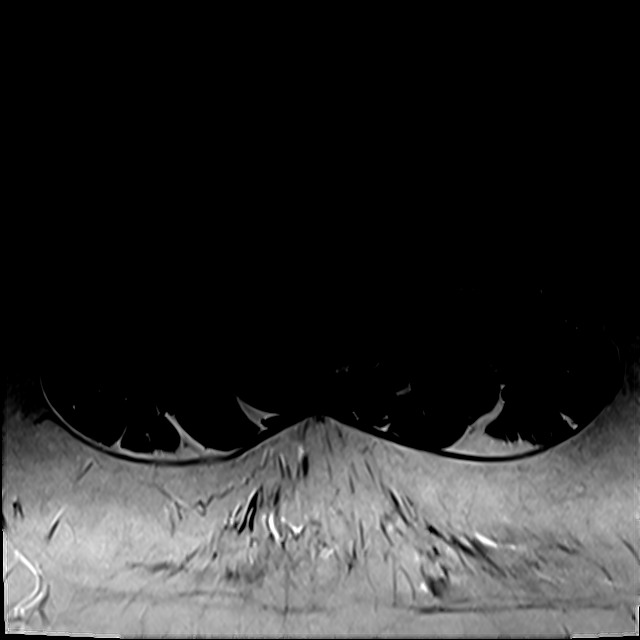
[im 36/42]
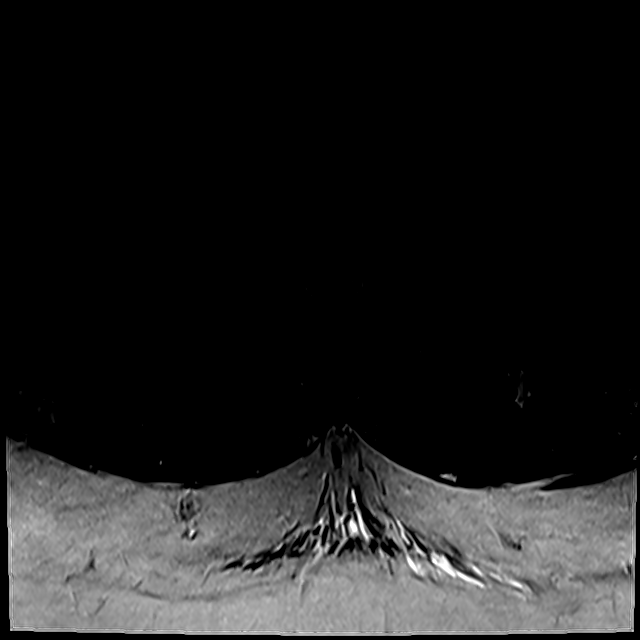

[18 of 48 positions shown; findings below may reference images not displayed]

FINDINGS: Segmentation:  Standard.

Alignment:  Physiologic.

Vertebrae: No fracture, evidence of discitis, or aggressive bone
lesion. Bone island in the L5 vertebral body.

Conus medullaris and cauda equina: Conus extends to the L1-2 level.
Conus and cauda equina appear normal.

Paraspinal and other soft tissues: Enlarged uterus with multiple
fibroids. Sacroiliac degenerative changes.

Disc levels:

T12-L1: No spinal canal or neural foraminal stenosis.

L1-2: No spinal canal or neural foraminal stenosis.

L2-3: No spinal canal or neural foraminal stenosis.

L3-4: Moderate facet degenerative changes. No spinal canal or neural
stenosis.

L4-5: Shallow disc bulge, prominent hypertrophic facet degenerative
changes and ligamentum flavum redundancy resulting in narrowing of
the bilateral subarticular zones. No significant spinal canal or
neural foraminal stenosis.

L5-S1: Prominent hypertrophic facet degenerative changes. No spinal
canal or neural foraminal stenosis.
IMPRESSION: 1. Prominent hypertrophic facet degenerative changes at L3-L4, L4-L5
and L5-S1, which may be a source of low back pain.
2. No significant spinal canal or neural foraminal stenosis at any
level.
3. Enlarged uterus with multiple fibroids.

## 2021-06-01 ENCOUNTER — Other Ambulatory Visit: Payer: Self-pay | Admitting: Family Medicine

## 2021-09-01 ENCOUNTER — Other Ambulatory Visit: Payer: Self-pay

## 2021-09-01 ENCOUNTER — Encounter (HOSPITAL_COMMUNITY): Payer: Self-pay

## 2021-09-01 ENCOUNTER — Ambulatory Visit (HOSPITAL_COMMUNITY)
Admission: EM | Admit: 2021-09-01 | Discharge: 2021-09-01 | Disposition: A | Discharge status: Home / Self Care | Payer: 59 | Attending: Family Medicine | Admitting: Family Medicine

## 2021-09-01 DIAGNOSIS — J0100 Acute maxillary sinusitis, unspecified: Secondary | ICD-10-CM | POA: Diagnosis not present

## 2021-09-01 MED ORDER — AMOXICILLIN-POT CLAVULANATE 875-125 MG PO TABS: 1.0000 | ORAL_TABLET | Freq: Two times a day (BID) | ORAL | 0 refills | Status: AC

## 2021-09-01 NOTE — ED Provider Notes (Signed)
[facility]    CSN: [id number] Arrival date & time: 09/01/21  1121      History   Chief Complaint No chief complaint on file.   HPI Diane Chen is a 51 y.o. female.   Patient is here for sinus congestion and drainage x 3 weeks.  This weekend it is more greenish/brown color.  Some sinus pressure/pain.  No fevers.  She has taken otc medication, mucinex, without any help.    Past Medical History:  Diagnosis Date   Hypertension     Patient Active Problem List   Diagnosis Date Noted   Chronic low back pain 03/20/2020   Muscle spasm 11/07/2019   Acute pain of left shoulder due to trauma 11/07/2019   Hypertension 08/07/2019   OBESITY, NOS 09/02/2006   RHINITIS, ALLERGIC 09/02/2006    Past Surgical History:  Procedure Laterality Date   TUBAL LIGATION      OB History   No obstetric history on file.      Home Medications    Prior to Admission medications   Medication Sig Start Date End Date Taking? Authorizing Provider  amoxicillin-clavulanate (AUGMENTIN) 875-125 MG tablet Take 1 tablet by mouth every 12 (twelve) hours. 02/23/21   Sherrill Raring, PA-C  cyclobenzaprine (FLEXERIL) 10 MG tablet TAKE ONE TABLET(10MG  TOTAL) BY MOUTH AT BEDTIME 11/22/19   Lattie Haw, MD  cyclobenzaprine (FLEXERIL) 5 MG tablet Take 5 mg by mouth 3 (three) times daily. 04/12/21   [provider]  hydrochlorothiazide (HYDRODIURIL) 25 MG tablet TAKE 1 TABLET BY MOUTH EVERY DAY 06/02/21 07/02/21  Simmons-Robinson, Riki Sheer, MD  meloxicam (MOBIC) 7.5 MG tablet Take 1 tablet (7.5 mg total) by mouth daily. 11/16/19   Lattie Haw, MD    Family History History reviewed. No pertinent family history.  Social History Social History   Tobacco Use   Smoking status: Never   Smokeless tobacco: Never  Substance Use Topics   Alcohol use: No   Drug use: No     Allergies   Patient has no known allergies.   Review of Systems Review of Systems  Constitutional:  Negative for  chills and fatigue.  HENT:  Positive for congestion, rhinorrhea, sinus pressure and sinus pain. Negative for ear pain.   Respiratory: Negative.    Cardiovascular: Negative.   Gastrointestinal: Negative.     Physical Exam Triage Vital Signs ED Triage Vitals  Enc Vitals Group     BP 09/01/21 1228 (!) 146/87     Pulse Rate 09/01/21 1228 70     Resp 09/01/21 1228 16     Temp 09/01/21 1228 97.8 F (36.6 C)     Temp Source 09/01/21 1228 Oral     SpO2 09/01/21 1228 100 %     Weight --      Height --      Head Circumference --      Peak Flow --      Pain Score 09/01/21 1229 0     Pain Loc --      Pain Edu? --      Excl. in [place]? --    No data found.  Updated Vital Signs BP (!) 146/87 (BP Location: Left Arm)    Pulse 70    Temp 97.8 F (36.6 C) (Oral)    Resp 16    SpO2 100%   Visual Acuity Right Eye Distance:   Left Eye Distance:   Bilateral Distance:    Right Eye Near:   Left Eye Near:  Bilateral Near:     Physical Exam Constitutional:      Appearance: Normal appearance.  HENT:     Head: Normocephalic and atraumatic.     Nose: Congestion present.     Right Sinus: Maxillary sinus tenderness present.     Left Sinus: Maxillary sinus tenderness present.  Cardiovascular:     Rate and Rhythm: Normal rate and regular rhythm.  Pulmonary:     Effort: Pulmonary effort is normal.     Breath sounds: Normal breath sounds.  Musculoskeletal:     Cervical back: Normal range of motion and neck supple.  Neurological:     Mental Status: She is alert.     UC Treatments / Results  Labs (all labs ordered are listed, but only abnormal results are displayed) Labs Reviewed - No data to display  EKG   Radiology No results found.  Procedures Procedures (including critical care time)  Medications Ordered in UC Medications - No data to display  Initial Impression / Assessment and Plan / UC Course  I have reviewed the triage vital signs and the nursing notes.  Pertinent  labs & imaging results that were available during my care of the patient were reviewed by me and considered in my medical decision making (see chart for details).    Final Clinical Impressions(s) / UC Diagnoses   Final diagnoses:  Acute non-recurrent maxillary sinusitis     Discharge Instructions      You were seen today for sinus infection.  Augmentin to the pharmacy, twice/day x 10 days.  You may also trial over the counter zyrtec daily as well as flonase to help with sinus pressure and fullness.      ED Prescriptions     Medication Sig Dispense Auth. Provider   amoxicillin-clavulanate (AUGMENTIN) 875-125 MG tablet Take 1 tablet by mouth every 12 (twelve) hours for 10 days. 20 tablet Rondel Oh, MD      PDMP not reviewed this encounter.   Rondel Oh, MD 09/01/21 1257

## 2021-09-01 NOTE — ED Triage Notes (Signed)
Pt presents to the office sinus pressure and congestion.

## 2021-09-01 NOTE — Discharge Instructions (Addendum)
You were seen today for sinus infection.  Augmentin to the pharmacy, twice/day x 10 days.  You may also trial over the counter zyrtec daily as well as flonase to help with sinus pressure and fullness.

## 2021-10-07 ENCOUNTER — Other Ambulatory Visit: Payer: Self-pay | Admitting: Family Medicine

## 2021-12-09 ENCOUNTER — Encounter: Payer: Self-pay | Admitting: *Deleted

## 2022-01-01 ENCOUNTER — Emergency Department (HOSPITAL_BASED_OUTPATIENT_CLINIC_OR_DEPARTMENT_OTHER): Payer: Self-pay

## 2022-01-01 ENCOUNTER — Encounter (HOSPITAL_BASED_OUTPATIENT_CLINIC_OR_DEPARTMENT_OTHER): Payer: Self-pay | Admitting: Emergency Medicine

## 2022-01-01 ENCOUNTER — Other Ambulatory Visit: Payer: Self-pay

## 2022-01-01 DIAGNOSIS — Z79899 Other long term (current) drug therapy: Secondary | ICD-10-CM | POA: Insufficient documentation

## 2022-01-01 DIAGNOSIS — R600 Localized edema: Secondary | ICD-10-CM | POA: Insufficient documentation

## 2022-01-01 DIAGNOSIS — I10 Essential (primary) hypertension: Secondary | ICD-10-CM | POA: Insufficient documentation

## 2022-01-01 NOTE — ED Triage Notes (Addendum)
Pt concerned about potential blood clot in left leg. Swelling noted x1 month, has been wearing compression socks with relief to swelling.  Reports sharp, intermittent pain to posterior left knee that radiates to front thigh.    Denies hx of blood clots, denies cardiac hx. No blood thinners. Denies CP, denies ShOB.

## 2022-01-01 NOTE — ED Notes (Signed)
Discussed pt with EDP Schlossman. Reviewed chart and triage note. Verbal order for  [place] LLE obtained. Orders placed, [place] tech informed.

## 2022-01-02 ENCOUNTER — Emergency Department (HOSPITAL_BASED_OUTPATIENT_CLINIC_OR_DEPARTMENT_OTHER)
Admission: EM | Admit: 2022-01-02 | Discharge: 2022-01-02 | Disposition: A | Discharge status: Home / Self Care | Payer: Self-pay | Attending: Emergency Medicine | Admitting: Emergency Medicine

## 2022-01-02 DIAGNOSIS — R600 Localized edema: Secondary | ICD-10-CM

## 2022-01-02 NOTE — ED Notes (Signed)
Discharge instructions discussed with pt. Pt verbalized understanding with no questions at this time.  

## 2022-01-02 NOTE — ED Notes (Signed)
Pt ambulatory from waiting room with slow steady gait; no acute distress noted.  Escorted by ED tech to room

## 2022-01-02 NOTE — ED Provider Notes (Signed)
[facility] Provider Note: Georgena Spurling, MD, FACEP  CSN: [id number] MRN: [id number] ARRIVAL: 01/01/22 at 2152 ROOM: DB012/DB012   CHIEF COMPLAINT  Leg Swelling (left)   HISTORY OF PRESENT ILLNESS  01/02/22 2:22 AM Diane Chen is a 51 y.o. female who has had swelling in her left leg for the past month.  She has been wearing compression stockings with some relief.  She has sharp intermittent pains to her posterior left knee that radiates to her anterior thigh.  This pain is not constant and not present currently.  She has no history of thromboembolic disease.  She is not on anticoagulation.   Past Medical History:  Diagnosis Date   Hypertension     Past Surgical History:  Procedure Laterality Date   TUBAL LIGATION      No family history on file.  Social History   Tobacco Use   Smoking status: Never   Smokeless tobacco: Never  Substance Use Topics   Alcohol use: No   Drug use: No    Prior to Admission medications   Medication Sig Start Date End Date Taking? Authorizing Provider  cyclobenzaprine (FLEXERIL) 10 MG tablet TAKE ONE TABLET('10MG'$  TOTAL) BY MOUTH AT BEDTIME 11/22/19   Lattie Haw, MD  cyclobenzaprine (FLEXERIL) 5 MG tablet Take 5 mg by mouth 3 (three) times daily. 04/12/21   [provider]  hydrochlorothiazide (HYDRODIURIL) 25 MG tablet TAKE 1 TABLET BY MOUTH EVERY DAY 10/07/21 11/06/21  Lattie Haw, MD  meloxicam (MOBIC) 7.5 MG tablet Take 1 tablet (7.5 mg total) by mouth daily. 11/16/19   Lattie Haw, MD    Allergies Patient has no known allergies.   REVIEW OF SYSTEMS  Negative except as noted here or in the History of Present Illness.   PHYSICAL EXAMINATION  Initial Vital Signs Blood pressure (!) 171/110, pulse 91, temperature 98.6 F (37 C), resp. rate (!) 22, SpO2 100 %.  Examination General: Well-developed, high BMI female in no acute distress; appearance consistent with age of record HENT: normocephalic; atraumatic Eyes:  Normal appearance Neck: supple Heart: regular rate and rhythm Lungs: clear to auscultation bilaterally Abdomen: soft; nondistended; nontender; bowel sounds present Extremities: No deformity; full range of motion; trace edema of left lower extremity; pulses normal Neurologic: Awake, alert and oriented; motor function intact in all extremities and symmetric; no facial droop Skin: Warm and dry Psychiatric: Normal mood and affect   RESULTS  Summary of this visit's results, reviewed and interpreted by myself:   EKG Interpretation  Date/Time:    Ventricular Rate:    PR Interval:    QRS Duration:   QT Interval:    QTC Calculation:   R Axis:     Text Interpretation:         Laboratory Studies: No results found for this or any previous visit (from the past 24 hour(s)). Imaging Studies: US Venous Img Lower Unilateral Left  Result Date: 01/01/2022 CLINICAL DATA:  Left lower extremity swelling, pain EXAM: LEFT LOWER EXTREMITY VENOUS DOPPLER ULTRASOUND TECHNIQUE: Gray-scale sonography with compression, as well as color and duplex ultrasound, were performed to evaluate the deep venous system(s) from the level of the common femoral vein through the popliteal and proximal calf veins. COMPARISON:  None Available. FINDINGS: VENOUS Normal compressibility of the common femoral, superficial femoral, and popliteal veins, as well as the visualized calf veins. Visualized portions of profunda femoral vein and great saphenous vein unremarkable. No filling defects to suggest DVT on grayscale or color Doppler imaging. Doppler  waveforms show normal direction of venous flow, normal respiratory plasticity and response to augmentation. Limited views of the contralateral common femoral vein are unremarkable. OTHER None. Limitations: Patient body habitus. Peroneal veins in the calf not well visualized. IMPRESSION: No evidence of left lower extremity DVT. Electronically Signed   By: Rolm Baptise M.D.   On: 01/01/2022  23:40    ED COURSE and MDM  Nursing notes, initial and subsequent vitals signs, including pulse oximetry, reviewed and interpreted by myself.  Vitals:   01/01/22 2159  BP: (!) 171/110  Pulse: 91  Resp: (!) 22  Temp: 98.6 F (37 C)  SpO2: 100%   Medications - No data to display  No evidence of the DVT on ultrasound.  Patient advised to continue wearing compression stockings and she may have some venous insufficiency.  PROCEDURES  Procedures   ED DIAGNOSES     ICD-10-CM   1. Edema of left lower extremity  R60.0          Mckaela Howley, MD 01/02/22 [phone]

## 2022-03-06 ENCOUNTER — Other Ambulatory Visit: Payer: Self-pay | Admitting: *Deleted

## 2022-03-09 MED ORDER — HYDROCHLOROTHIAZIDE 25 MG PO TABS: 25.0000 mg | ORAL_TABLET | Freq: Every day | ORAL | 3 refills | Status: DC

## 2022-07-04 ENCOUNTER — Other Ambulatory Visit: Payer: Self-pay | Admitting: Student

## 2022-12-24 ENCOUNTER — Emergency Department (HOSPITAL_COMMUNITY): Payer: Medicaid Other

## 2022-12-24 ENCOUNTER — Other Ambulatory Visit: Payer: Self-pay

## 2022-12-24 ENCOUNTER — Encounter (HOSPITAL_COMMUNITY): Payer: Self-pay

## 2022-12-24 ENCOUNTER — Emergency Department (HOSPITAL_COMMUNITY)
Admission: EM | Admit: 2022-12-24 | Discharge: 2022-12-24 | Disposition: A | Discharge status: Home / Self Care | Payer: Medicaid Other | Attending: Emergency Medicine | Admitting: Emergency Medicine

## 2022-12-24 DIAGNOSIS — R42 Dizziness and giddiness: Secondary | ICD-10-CM | POA: Insufficient documentation

## 2022-12-24 DIAGNOSIS — R0602 Shortness of breath: Secondary | ICD-10-CM | POA: Insufficient documentation

## 2022-12-24 DIAGNOSIS — R002 Palpitations: Secondary | ICD-10-CM | POA: Diagnosis not present

## 2022-12-24 DIAGNOSIS — Z79899 Other long term (current) drug therapy: Secondary | ICD-10-CM | POA: Diagnosis not present

## 2022-12-24 DIAGNOSIS — I10 Essential (primary) hypertension: Secondary | ICD-10-CM | POA: Insufficient documentation

## 2022-12-24 DIAGNOSIS — R791 Abnormal coagulation profile: Secondary | ICD-10-CM | POA: Insufficient documentation

## 2022-12-24 LAB — BASIC METABOLIC PANEL
Anion gap: 9 (ref 5–15)
BUN: 21 mg/dL — ABNORMAL HIGH (ref 6–20)
CO2: 24 mmol/L (ref 22–32)
Calcium: 9.4 mg/dL (ref 8.9–10.3)
Chloride: 106 mmol/L (ref 98–111)
Creatinine, Ser: 0.91 mg/dL (ref 0.44–1.00)
GFR, Estimated: >60 mL/min (ref >60)
Glucose, Bld: 97 mg/dL (ref 70–99)
Potassium: 3.5 mmol/L (ref 3.5–5.1)
Sodium: 139 mmol/L (ref 135–145)

## 2022-12-24 LAB — TSH: TSH: 3.347 u[IU]/mL (ref 0.350–4.500)

## 2022-12-24 LAB — CBC
HCT: 43.4 % (ref 36.0–46.0)
Hemoglobin: 14 g/dL (ref 12.0–15.0)
MCH: 27.5 pg (ref 26.0–34.0)
MCHC: 32.3 g/dL (ref 30.0–36.0)
MCV: 85.1 fL (ref 80.0–100.0)
Platelets: 303 10*3/uL (ref 150–400)
RBC: 5.1 MIL/uL (ref 3.87–5.11)
RDW: 14.4 % (ref 11.5–15.5)
WBC: 9.5 10*3/uL (ref 4.0–10.5)
nRBC: 0 % (ref 0.0–0.2)

## 2022-12-24 LAB — I-STAT BETA HCG BLOOD, ED (MC, WL, AP ONLY): I-stat hCG, quantitative: <5 m[IU]/mL (ref <5)

## 2022-12-24 LAB — TROPONIN I (HIGH SENSITIVITY)
Troponin I (High Sensitivity): 6 ng/L (ref <18)
Troponin I (High Sensitivity): 7 ng/L (ref <18)

## 2022-12-24 LAB — D-DIMER, QUANTITATIVE: D-Dimer, Quant: 0.69 ug/mL-FEU — ABNORMAL HIGH (ref 0.00–0.50)

## 2022-12-24 MED ORDER — IOHEXOL 350 MG/ML SOLN
80.0000 mL | Freq: Once | INTRAVENOUS | Status: AC | PRN
  Administered 2022-12-24: 80 mL via INTRAVENOUS

## 2022-12-24 NOTE — ED Notes (Signed)
Lab called to add on troponin 

## 2022-12-24 NOTE — ED Provider Notes (Signed)
[facility] AT [facility] Provider Note   CSN: [id number] Arrival date & time: 12/24/22  1827     History  Chief Complaint  Patient presents with   Shortness of Breath   Palpitations   Dizziness    Diane Chen is a 52 y.o. female.   Shortness of Breath Palpitations Associated symptoms: dizziness and shortness of breath   Dizziness Associated symptoms: palpitations and shortness of breath     52 year old female presents emergency department with complaints of shortness of breath, palpitations, lightheadedness.  Patient states that she has been intermittently dealing with palpitations and feelings of shortness of breath.  Patient denies any known exacerbating or relieving factors but states it been coming "more often than they have not" over the past 2 weeks.  States that she had episode earlier today when she woke up from a nap, began to feel palpitations, shortness of breath and lightheadedness as if she were going to pass out.  States that symptoms lasted several minutes before spontaneous resolution.  Reports some mild symptoms of shortness of breath currently.  States he has a family history of both her mother and father having arrhythmias with ICD placement but is unaware of what exactly the arrhythmia was.  Denies any fever, cough, abdominal pain, nausea, vomiting, urinary symptoms, change in bowel habits.  Denies any visual disturbance, gait abnormality, slurred speech, facial droop, weakness/sensory deficits in upper extremities.  Past medical history significant for hypertension, chronic low back pain, obesity  Home Medications Prior to Admission medications   Medication Sig Start Date End Date Taking? Authorizing Provider  cyclobenzaprine (FLEXERIL) 10 MG tablet TAKE ONE TABLET(10MG  TOTAL) BY MOUTH AT BEDTIME 11/22/19   Cathleen Corti, MD  cyclobenzaprine (FLEXERIL) 5 MG tablet Take 5 mg by mouth 3 (three) times daily. 04/12/21    [provider]  hydrochlorothiazide (HYDRODIURIL) 25 MG tablet TAKE 1 TABLET(25 MG) BY MOUTH DAILY 07/08/22   Alicia Amel, MD  meloxicam (MOBIC) 7.5 MG tablet Take 1 tablet (7.5 mg total) by mouth daily. 11/16/19   Cathleen Corti, MD      Allergies    Penicillins    Review of Systems   Review of Systems  Respiratory:  Positive for shortness of breath.   Cardiovascular:  Positive for palpitations.  Neurological:  Positive for dizziness.  All other systems reviewed and are negative.   Physical Exam Updated Vital Signs BP (!) 151/98   Pulse 61   Temp 98.3 F (36.8 C) (Oral)   Resp 16   Ht 5\' 5"  (1.651 m)   Wt (!) 165.6 kg   SpO2 100%   BMI 60.74 kg/m  Physical Exam Vitals and nursing note reviewed.  Constitutional:      General: She is not in acute distress.    Appearance: She is well-developed.  HENT:     Head: Normocephalic and atraumatic.  Eyes:     Conjunctiva/sclera: Conjunctivae normal.  Cardiovascular:     Rate and Rhythm: Normal rate and regular rhythm.     Heart sounds: No murmur heard. Pulmonary:     Effort: Pulmonary effort is normal. No respiratory distress.     Breath sounds: Normal breath sounds. No wheezing, rhonchi or rales.  Abdominal:     Palpations: Abdomen is soft.     Tenderness: There is no abdominal tenderness. There is no guarding.  Musculoskeletal:        General: No swelling.     Cervical back:  Neck supple.     Right lower leg: No edema.     Left lower leg: No edema.  Skin:    General: Skin is warm and dry.     Capillary Refill: Capillary refill takes less than 2 seconds.  Neurological:     Mental Status: She is alert.     Comments: Alert and oriented to self, place, time and event.   Speech is fluent, clear without dysarthria or dysphasia.   Strength 5/5 in upper/lower extremities   Sensation intact in upper/lower extremities   Normal gait.  No pronator drift.  Normal finger-to-nose and feet tapping.  CN I  not tested  CN II not tested CN III, IV, VI PERRLA and EOMs intact bilaterally  CN V Intact sensation to sharp and light touch to the face  CN VII facial movements symmetric  CN VIII not tested  CN IX, X no uvula deviation, symmetric rise of soft palate  CN XI 5/5 SCM and trapezius strength bilaterally  CN XII Midline tongue protrusion, symmetric L/R movements     Psychiatric:        Mood and Affect: Mood normal.     ED Results / Procedures / Treatments   Labs (all labs ordered are listed, but only abnormal results are displayed) Labs Reviewed  BASIC METABOLIC PANEL - Abnormal; Notable for the following components:      Result Value   BUN 21 (*)    All other components within normal limits  D-DIMER, QUANTITATIVE - Abnormal; Notable for the following components:   D-Dimer, Quant 0.69 (*)    All other components within normal limits  CBC  TSH  I-STAT BETA HCG BLOOD, ED ([facility], [facility], [facility] ONLY)  TROPONIN I (HIGH SENSITIVITY)  TROPONIN I (HIGH SENSITIVITY)    EKG None  Radiology CT Angio Chest PE W/Cm &/Or Wo Cm  Result Date: 12/24/2022 CLINICAL DATA:  Cardiac palpitations and shortness of breath EXAM: CT ANGIOGRAPHY CHEST WITH CONTRAST TECHNIQUE: Multidetector CT imaging of the chest was performed using the standard protocol during bolus administration of intravenous contrast. Multiplanar CT image reconstructions and MIPs were obtained to evaluate the vascular anatomy. RADIATION DOSE REDUCTION: This exam was performed according to the departmental dose-optimization program which includes automated exposure control, adjustment of the mA and/or kV according to patient size and/or use of iterative reconstruction technique. CONTRAST:  80mL OMNIPAQUE IOHEXOL 350 MG/ML SOLN COMPARISON:  None Available. FINDINGS: Cardiovascular: Thoracic aorta shows no aneurysmal dilatation or dissection. Pulmonary artery shows a normal branching pattern without filling defect to suggest pulmonary embolism. No  cardiac enlargement is seen. No coronary calcifications are noted. Mediastinum/Nodes: Thoracic inlet is within normal limits. No hilar or mediastinal adenopathy is noted. The esophagus is within normal limits. Lungs/Pleura: Lungs are well aerated bilaterally. Mild emphysematous changes are seen. No focal infiltrate or effusion is noted. No sizable parenchymal nodule is seen. Upper Abdomen: Visualized upper abdomen is within normal limits. Musculoskeletal: Degenerative changes of the thoracic spine are noted. No acute rib abnormality is noted. Review of the MIP images confirms the above findings. IMPRESSION: No evidence of pulmonary emboli. Mild emphysematous changes. Electronically Signed   By: Alcide Clever M.D.   On: 12/24/2022 21:23   DG Chest 2 View  Result Date: 12/24/2022 CLINICAL DATA:  Shortness of breath, heart palpitation, dizziness EXAM: CHEST - 2 VIEW COMPARISON:  10/25/2013 FINDINGS: Cardiac and mediastinal contours are within normal limits. No focal pulmonary opacity. No pleural effusion or pneumothorax. No acute osseous  abnormality. IMPRESSION: No acute cardiopulmonary process. Electronically Signed   By: Wiliam Ke M.D.   On: 12/24/2022 19:19    Procedures Procedures    Medications Ordered in ED Medications  iohexol (OMNIPAQUE) 350 MG/ML injection 80 mL (80 mLs Intravenous Contrast Given 12/24/22 2101)    ED Course/ Medical Decision Making/ A&P Clinical Course as of 12/24/22 2222  Thu Dec 24, 2022  2154 Reevaluation of the patient showed again with no episodes of palpitations, shortness of breath throughout hospital visit now observed for 3 and half hours.  Patient ambulated again with no exacerbation of symptoms.  Awaiting second troponin will plan to discharge if negative with close outpatient follow-up with cardiology. [CR]    Clinical Course User Index [CR] Peter Garter, PA                             Medical Decision Making Amount and/or Complexity of Data  Reviewed Labs: ordered. Radiology: ordered.  Risk Prescription drug management.   This patient presents to the ED for concern of shortness of breath, palpitations, this involves an extensive number of treatment options, and is a complaint that carries with it a high risk of complications and morbidity.  The differential diagnosis includes ACS, PE, hyperthyroidism, electrolyte derangement, arrhythmia, cardiomyopathy, pericarditis, valvular disease, caffeine, EtOH, anemia  Co morbidities that complicate the patient evaluation  See HPI   Additional history obtained:  Additional history obtained from EMR External records from outside source obtained and reviewed including hospital record's   Lab Tests:  I Ordered, and personally interpreted labs.  The pertinent results include: No leukocytosis noted.  No evidence anemia.  Platelets within range.  No electrolyte abnormalities.  No renal dysfunction.  Initial troponin of 6 with repeat 7.  D-dimer elevated 1.69.  hCG negative.   Imaging Studies ordered:  I ordered imaging studies including chest x-ray, CT angio chest PE I independently visualized and interpreted imaging which showed  Chest x-ray: No acute cardiopulmonary abnormalities CT angio chest PE: No evidence of PE.  Mild emphysematous changes I agree with the radiologist interpretation   Cardiac Monitoring: / EKG:  The patient was maintained on a cardiac monitor.  I personally viewed and interpreted the cardiac monitored which showed an underlying rhythm of: Sinus rhythm with left axis deviation.   Consultations Obtained:  N/a   Problem List / ED Course / Critical interventions / Medication management  Palpitations, shortness of breath Reevaluation of the patient showed that the patient stayed the same I have reviewed the patients home medicines and have made adjustments as needed   Social Determinants of Health:  Denies tobacco, illicit drug use   Test /  Admission - Considered:  Palpitations, shortness of breath Vitals signs significant for hypertension with blood pressure 143/84. Otherwise within normal range and stable throughout visit. Laboratory/imaging studies significant for: See above 52 year old female presents emergency department with 2-week long history of intermittent palpitations as well as shortness of breath.  Workup today overall reassuring.  No evidence of anemia.  Patient with delta negative troponin with out acute ischemic changes on EKG.  Patient with heart score of 0-3 so doubt ACS.  Patient with initially elevated D-dimer of which subsequently CT angio chest PE study was performed which was negative for PE.  Patient with TSH within normal limits I doubt hyperthyroidism.  Suspect patient going into arrhythmia/dysrhythmia that will warrant potential cardiac monitoring in outpatient setting.  Further workup  deemed unnecessary at this time while emergency department.  Will make referral for cardiology and give patient information to set up an appointment.  Treatment plan discussed at length with patient and she acknowledged understanding was agreeable to said plan.  Patient overall well-appearing, afebrile in no acute distress. Worrisome signs and symptoms were discussed with the patient, and the patient acknowledged understanding to return to the ED if noticed. Patient was stable upon discharge.          Final Clinical Impression(s) / ED Diagnoses Final diagnoses:  Palpitations    Rx / DC Orders ED Discharge Orders          Ordered    Ambulatory referral to Cardiology        12/24/22 2155              Sueko, Shafi, [place] 12/24/22 2222    Melene Plan, DO 12/24/22 2222

## 2022-12-24 NOTE — ED Triage Notes (Signed)
Heart palpitations for 2 weeks. Pt states today she woke up from a nap and was dizzy and lightheaded and then felt SOB. Pt states she feels SOB at rest. Denies fever, chills, body aches.

## 2022-12-24 NOTE — Discharge Instructions (Signed)
As discussed, workup today overall reassuring.  No evidence of pneumonia, blood clot in the lung, heart attack.  I suspect that you are going into an abnormal heart rhythm causing your symptoms.  Will send in cardiology referral to set up an appointment for potential cardiac monitoring.  Will also attach information for office on your discharge papers; please call at your earliest convenience tomorrow morning to set up an appointment.  Please do not hesitate to return to emergency department if the worrisome signs and symptoms we discussed become apparent.

## 2022-12-25 ENCOUNTER — Other Ambulatory Visit (INDEPENDENT_AMBULATORY_CARE_PROVIDER_SITE_OTHER): Payer: Medicaid Other

## 2022-12-25 ENCOUNTER — Encounter (HOSPITAL_BASED_OUTPATIENT_CLINIC_OR_DEPARTMENT_OTHER): Payer: Self-pay | Admitting: Cardiology

## 2022-12-25 ENCOUNTER — Ambulatory Visit (INDEPENDENT_AMBULATORY_CARE_PROVIDER_SITE_OTHER): Payer: Medicaid Other | Admitting: Cardiology

## 2022-12-25 VITALS — BP 138/82 | HR 71 | Ht 65.0 in | Wt 364.4 lb

## 2022-12-25 DIAGNOSIS — R0602 Shortness of breath: Secondary | ICD-10-CM | POA: Diagnosis not present

## 2022-12-25 DIAGNOSIS — Z712 Person consulting for explanation of examination or test findings: Secondary | ICD-10-CM | POA: Diagnosis not present

## 2022-12-25 DIAGNOSIS — R002 Palpitations: Secondary | ICD-10-CM

## 2022-12-25 DIAGNOSIS — Z09 Encounter for follow-up examination after completed treatment for conditions other than malignant neoplasm: Secondary | ICD-10-CM | POA: Diagnosis not present

## 2022-12-25 NOTE — Patient Instructions (Signed)
Medication Instructions:  Your physician recommends that you continue on your current medications as directed. Please refer to the Current Medication list given to you today.   Labwork: NONE  Testing/Procedures: 2 WEEK ZIO   Follow-Up: TO BE DETERMINED BASED ON RESULTS   Any Other Special Instructions Will Be Listed Below (If Applicable).  ZIO XT- Long Term Monitor Instructions  Your physician has requested you wear a ZIO patch monitor for 14 days.  This is a single patch monitor. [employer] supplies one patch monitor per enrollment. Additional stickers are not available. Please do not apply patch if you will be having a Nuclear Stress Test,  Echocardiogram, Cardiac CT, MRI, or Chest Xray during the period you would be wearing the  monitor. The patch cannot be worn during these tests. You cannot remove and re-apply the  ZIO XT patch monitor.  Your ZIO patch monitor will be mailed 3 day USPS to your address on file. It may take 3-5 days  to receive your monitor after you have been enrolled.  Once you have received your monitor, please review the enclosed instructions. Your monitor  has already been registered assigning a specific monitor serial # to you.  Billing and Patient Assistance Program Information  We have supplied [employer] with any of your insurance information on file for billing purposes. [employer] offers a sliding scale Patient Assistance Program for patients that do not have  insurance, or whose insurance does not completely cover the cost of the ZIO monitor.  You must apply for the Patient Assistance Program to qualify for this discounted rate.  To apply, please call [employer] at [phone], select option 4, select option 2, ask to apply for  Patient Assistance Program. Meredeth Ide will ask your household income, and how many people  are in your household. They will quote your out-of-pocket cost based on that information.  [employer] will also be able to set up a 72-month,  interest-free payment plan if needed.  Applying the monitor   Shave hair from upper left chest.  Hold abrader disc by orange tab. Rub abrader in 40 strokes over the upper left chest as  indicated in your monitor instructions.  Clean area with 4 enclosed alcohol pads. Let dry.  Apply patch as indicated in monitor instructions. Patch will be placed under collarbone on left  side of chest with arrow pointing upward.  Rub patch adhesive wings for 2 minutes. Remove white label marked "1". Remove the white  label marked "2". Rub patch adhesive wings for 2 additional minutes.  While looking in a mirror, press and release button in center of patch. A small green light will  flash 3-4 times. This will be your only indicator that the monitor has been turned on.  Do not shower for the first 24 hours. You may shower after the first 24 hours.  Press the button if you feel a symptom. You will hear a small click. Record Date, Time and  Symptom in the Patient Logbook.  When you are ready to remove the patch, follow instructions on the last 2 pages of Patient  Logbook. Stick patch monitor onto the last page of Patient Logbook.  Place Patient Logbook in the blue and white box. Use locking tab on box and tape box closed  securely. The blue and white box has prepaid postage on it. Please place it in the mailbox as  soon as possible. Your physician should have your test results approximately 7 days after the  monitor has been  mailed back to [facility].  Call [facility] Customer Care at [phone] if you have questions regarding  your ZIO XT patch monitor. Call them immediately if you see an orange light blinking on your  monitor.  If your monitor falls off in less than 4 days, contact our Monitor department at [phone].  If your monitor becomes loose or falls off after 4 days call [employer] at [phone] for  suggestions on securing your monitor

## 2022-12-25 NOTE — Progress Notes (Signed)
Cardiology Office Note:  .   Date:  12/25/2022  ID:  Illene Regulus, DOB 11/18/1970, MRN [id number] PCP: [name], DO  [facility] Providers Cardiologist:  Jodelle Red, MD {  History of Present Illness: .   Diane Chen is a 52 y.o. female without prior cardiac history who is referred for consultation of palpitations.  Er note from yesterday (12/24/22) reviewed. Presented with shortness of breath, palpitations, lightheadedness. Workup unremarkable, no arrhythmia seen on monitor, unremarkable hsTN and ECG. Referred to cardiology for further evaluation.  Tachycardia/palpitations: -Initial onset: about two weeks ago, feels like a fluttering. Occurs both at rest and with activity.  -Frequency/Duration: up to multiple times in a day, last seconds -Associated symptoms: yesterday was the first time she felt lightheaded and short of breath.  -Aggravating/alleviating factors: none that she has noticed -Syncope/near syncope: none -Prior cardiac history: none -Prior ECG: sinus -Prior workup: none -Prior treatment: none -Possible medication interactions: none -Caffeine: rare -Alcohol: none -Tobacco: never -OTC supplements: none -Comorbidities: obesity, BMI 60. Hypertension. -Exercise level: limited due to back injury 3 years ago -Labs: TSH/kidney function/electrolytes, CBC reviewed. -Cardiac ROS: no chest pain, no shortness of breath other than yesterday, no PND, no orthopnea, no LE edema. -Family history:  mother had unknown heart issues, died of a blood clot after knee surgery.  Father has ICD.  ROS: ROS otherwise negative except as noted.   Studies Reviewed: Marland Kitchen    EKG:     12/24/22 SR 98 bpm  Physical Exam:   VS:  BP 138/82 (BP Location: Left Arm, Patient Position: Sitting, Cuff Size: Large)   Pulse 71   Ht 5\' 5"  (1.651 m)   Wt (!) 364 lb 6.4 oz (165.3 kg)   SpO2 98%   BMI 60.64 kg/m    Wt Readings from Last 3 Encounters:  12/25/22 (!) 364 lb 6.4 oz  (165.3 kg)  12/24/22 (!) 365 lb (165.6 kg)  02/23/21 (!) 365 lb (165.6 kg)    GEN: Well nourished, well developed in no acute distress HEENT: Normal, moist mucous membranes NECK: No JVD CARDIAC: regular rhythm, normal S1 and S2, no rubs or gallops. No murmur. VASCULAR: Radial and DP pulses 2+ bilaterally. No carotid bruits RESPIRATORY:  Clear to auscultation without rales, wheezing or rhonchi  ABDOMEN: Soft, non-tender, non-distended MUSCULOSKELETAL:  Ambulates independently SKIN: Warm and dry, no edema NEUROLOGIC:  Alert and oriented x 3. No focal neuro deficits noted. PSYCHIATRIC:  Normal affect    ASSESSMENT AND PLAN: .   Palpitations, shortness of breath, lightheadedness ER visit review, review of test results -we discussed the potential causes of fast heart rates and palpitations today. Reviewed the normal electrical system of the heart. Reviewed the role of the sinus node. Reviewed the balance between resting (vagal) tone and fight or flight nervous system input. Reviewed how exercise improves vagal tone and lowers resting heart rate. Reviewed that sinus tachycardia, or elevated sinus rate, is usually secondary to something else in the body. This can include pain, stress, infection, anxiety, hormone imbalance, low blood counts, etc. Discussed that we do not typically treat sinus tachycardia by itself, and instead the focus is on finding what is driving the heart rate and treating that. We discussed that there can be other rhythm issues, from either the top or bottom of the heart, that are abnormal rhythms. Discussed how we evaluate for these.  -will get two week Zio monitor. If abnormal, would get echo. -reviewed red flag warning signs that  need immediate medical attention  Dispo: TBD based on results of monitor  Signed, Jodelle Red, MD   Jodelle Red, MD, PhD, [facility] [place]  [facility]  [place]  [facility] & Vascular at [facility] [address], [address] [place], [place] [address] [phone]

## 2023-01-02 NOTE — Progress Notes (Unsigned)
  SUBJECTIVE:   CHIEF COMPLAINT / HPI:   Requesting cholesterol and diabetic screening.  PERTINENT  PMH / PSH: HTN, obesity  Patient Care Team: [name], DO as PCP - General (Family Medicine) Jodelle Red, MD as PCP - Cardiology (Cardiology) OBJECTIVE:  There were no vitals taken for this visit. Physical Exam   ASSESSMENT/PLAN:  There are no diagnoses linked to this encounter. No follow-ups on file. [name], DO 01/02/2023, 12:28 PM PGY-***, [facility] {    This will disappear when note is signed, click to select method of visit    :1}

## 2023-01-04 ENCOUNTER — Encounter: Payer: Self-pay | Admitting: Student

## 2023-01-04 ENCOUNTER — Ambulatory Visit: Payer: Medicaid Other | Admitting: Student

## 2023-01-04 VITALS — BP 123/70 | HR 71 | Ht 65.0 in | Wt 362.8 lb

## 2023-01-04 DIAGNOSIS — Z13220 Encounter for screening for lipoid disorders: Secondary | ICD-10-CM

## 2023-01-04 DIAGNOSIS — Z131 Encounter for screening for diabetes mellitus: Secondary | ICD-10-CM | POA: Diagnosis not present

## 2023-01-04 DIAGNOSIS — Z114 Encounter for screening for human immunodeficiency virus [HIV]: Secondary | ICD-10-CM | POA: Diagnosis not present

## 2023-01-04 DIAGNOSIS — Z1159 Encounter for screening for other viral diseases: Secondary | ICD-10-CM | POA: Diagnosis not present

## 2023-01-04 DIAGNOSIS — I10 Essential (primary) hypertension: Secondary | ICD-10-CM

## 2023-01-04 LAB — POCT GLYCOSYLATED HEMOGLOBIN (HGB A1C): Hemoglobin A1C: 5.2 % (ref 4.0–5.6)

## 2023-01-04 MED ORDER — HYDROCHLOROTHIAZIDE 25 MG PO TABS: 25.0000 mg | ORAL_TABLET | Freq: Every day | ORAL | 3 refills | Status: DC

## 2023-01-04 NOTE — Assessment & Plan Note (Addendum)
BP: 123/70 today. Well controlled. Goal of <130/80.  Reviewed recent BMP, normal kidney function and electrolytes. Medication regimen: HCTZ 25 mg daily

## 2023-01-04 NOTE — Patient Instructions (Signed)
It was great to see you today! Thank you for choosing [facility] for your primary care. Diane Chen was seen for diabetes and cholesterol screening.  Today we addressed: We talked about further getting caught up on your health care gaps.  Next time we will do Pap and discuss things such as vaccines, mammogram, colonoscopy.  Excellent job on taking your health into your own hands.  It was great meeting you.  If you haven't already, sign up for My Chart to have easy access to your labs results, and communication with your primary care physician.  We are checking some labs today. If they are abnormal, I will call you. If they are normal, I will send you a MyChart message (if it is active) or a letter in the mail. If you do not hear about your labs in the next 2 weeks, please call the office. I recommend that you always bring your medications to each appointment as this makes it easy to ensure you are on the correct medications and helps [place] not miss refills when you need them.  Please arrive 15 minutes before your appointment to ensure smooth check in process.  We appreciate your efforts in making this happen.  Thank you for allowing me to participate in your care, [name], DO 01/04/2023, 9:39 AM PGY-3, [facility]

## 2023-01-05 ENCOUNTER — Encounter: Payer: Self-pay | Admitting: Student

## 2023-01-05 ENCOUNTER — Other Ambulatory Visit: Payer: Self-pay | Admitting: Student

## 2023-01-05 DIAGNOSIS — E785 Hyperlipidemia, unspecified: Secondary | ICD-10-CM | POA: Insufficient documentation

## 2023-01-05 LAB — LIPID PANEL
Chol/HDL Ratio: 2.8 ratio (ref 0.0–4.4)
Cholesterol, Total: 193 mg/dL (ref 100–199)
HDL: 70 mg/dL (ref >39)
LDL Chol Calc (NIH): 112 mg/dL — ABNORMAL HIGH (ref 0–99)
Triglycerides: 58 mg/dL (ref 0–149)
VLDL Cholesterol Cal: 11 mg/dL (ref 5–40)

## 2023-01-05 LAB — HIV ANTIBODY (ROUTINE TESTING W REFLEX): HIV Screen 4th Generation wRfx: NONREACTIVE

## 2023-01-05 LAB — HEPATITIS C ANTIBODY: Hep C Virus Ab: NONREACTIVE

## 2023-01-29 ENCOUNTER — Other Ambulatory Visit (HOSPITAL_COMMUNITY)
Admission: RE | Admit: 2023-01-29 | Discharge: 2023-01-29 | Disposition: A | Discharge status: Home / Self Care | Payer: Medicaid Other | Source: Ambulatory Visit | Attending: Family Medicine | Admitting: Family Medicine

## 2023-01-29 ENCOUNTER — Encounter: Payer: Self-pay | Admitting: Student

## 2023-01-29 ENCOUNTER — Ambulatory Visit: Payer: Medicaid Other | Admitting: Student

## 2023-01-29 VITALS — BP 130/86 | HR 71 | Ht 65.0 in | Wt 361.8 lb

## 2023-01-29 DIAGNOSIS — Z124 Encounter for screening for malignant neoplasm of cervix: Secondary | ICD-10-CM

## 2023-01-29 DIAGNOSIS — Z1211 Encounter for screening for malignant neoplasm of colon: Secondary | ICD-10-CM | POA: Diagnosis not present

## 2023-01-29 DIAGNOSIS — Z1231 Encounter for screening mammogram for malignant neoplasm of breast: Secondary | ICD-10-CM | POA: Diagnosis not present

## 2023-01-29 DIAGNOSIS — E782 Mixed hyperlipidemia: Secondary | ICD-10-CM | POA: Diagnosis present

## 2023-01-29 MED ORDER — ZOSTER VAC RECOMB ADJUVANTED 50 MCG/0.5ML IM SUSR: 0.5000 mL | Freq: Once | INTRAMUSCULAR | 0 refills | Status: AC

## 2023-01-29 NOTE — Patient Instructions (Addendum)
It was great to see you today! Thank you for choosing [facility] for your primary care. Diane Chen was seen for health maintenance.  Today we addressed: We have discussed your cholesterol level.  Recommend lifestyle changes but no cholesterol medication at this time.  Excellent job on getting involved with a weight loss clinic. I will be in touch with you for the results of the Pap.  I have placed orders for mammogram and colonoscopy. Please come back to get your Tdap vaccine for nursing visit I have provided a prescription for your shingles vaccine  If you haven't already, sign up for My Chart to have easy access to your labs results, and communication with your primary care physician.  Return if symptoms worsen or fail to improve. Please arrive 15 minutes before your appointment to ensure smooth check in process.  We appreciate your efforts in making this happen.  Thank you for allowing me to participate in your care, [name], DO 01/29/2023, 9:23 AM PGY-3, [facility]

## 2023-01-29 NOTE — Progress Notes (Signed)
  SUBJECTIVE:   CHIEF COMPLAINT / HPI:   Presenting today for health maintenance purposes.  She is requesting Pap smear and we will discuss mammogram and colonoscopy as previously planned.  Hyperlipidemia: ASCVD risk score 3.3% discussed.  Declined Tdap booster today, states she will come back later for this.  PERTINENT  PMH / PSH: HTN, HLD  Patient Care Team: [name], DO as PCP - General (Family Medicine) Jodelle Red, MD as PCP - Cardiology (Cardiology) OBJECTIVE:  BP 130/86   Pulse 71   Ht 5\' 5"  (1.651 m)   Wt (!) 361 lb 12.8 oz (164.1 kg)   LMP 12/28/2022 (Approximate)   SpO2 98%   BMI 60.21 kg/m  Pelvic exam: VULVA: normal appearing vulva with no masses, tenderness or lesions, VAGINA: normal appearing vagina with normal color and discharge, no lesions, CERVIX: normal appearing cervix without discharge or lesions.  Chaperone: Glori Bickers, RN  ASSESSMENT/PLAN:  Moderate mixed hyperlipidemia not requiring statin therapy Assessment & Plan: Discussed ASCVD risk score 3.3%, no recommendation for statin at this time given 10-year risk less than 5%.  However we discussed healthy lifestyle choices.  She is currently being set up with weight loss clinic.   Encounter for screening mammogram for malignant neoplasm of breast -     Digital Screening Mammogram, Left and Right; Future  Screening for cervical cancer -     Cytology - PAP  Screen for colon cancer -     Ambulatory referral to Gastroenterology  Other orders -     Zoster Vac Recomb Adjuvanted; Inject 0.5 mLs into the muscle once for 1 dose.  Dispense: 0.5 mL; Refill: 0  Return if symptoms worsen or fail to improve. [name], DO 01/29/2023, 9:47 AM PGY-3, [facility]

## 2023-01-29 NOTE — Assessment & Plan Note (Signed)
Discussed ASCVD risk score 3.3%, no recommendation for statin at this time given 10-year risk less than 5%.  However we discussed healthy lifestyle choices.  She is currently being set up with weight loss clinic.

## 2023-02-03 ENCOUNTER — Telehealth (HOSPITAL_BASED_OUTPATIENT_CLINIC_OR_DEPARTMENT_OTHER): Payer: Self-pay | Admitting: Cardiology

## 2023-02-03 NOTE — Telephone Encounter (Signed)
Will forward to Dr Cristal Deer to review

## 2023-02-03 NOTE — Telephone Encounter (Signed)
Patient called to follow-up on heart monitor results.

## 2023-02-08 ENCOUNTER — Encounter: Payer: Self-pay | Admitting: Internal Medicine

## 2023-02-11 ENCOUNTER — Other Ambulatory Visit: Payer: Self-pay | Admitting: Student

## 2023-02-11 ENCOUNTER — Ambulatory Visit
Admission: RE | Admit: 2023-02-11 | Discharge: 2023-02-11 | Disposition: A | Discharge status: Home / Self Care | Payer: Medicaid Other | Source: Ambulatory Visit | Attending: Family Medicine | Admitting: Family Medicine

## 2023-02-11 DIAGNOSIS — Z1231 Encounter for screening mammogram for malignant neoplasm of breast: Secondary | ICD-10-CM

## 2023-02-11 DIAGNOSIS — E782 Mixed hyperlipidemia: Secondary | ICD-10-CM

## 2023-02-11 DIAGNOSIS — Z1211 Encounter for screening for malignant neoplasm of colon: Secondary | ICD-10-CM

## 2023-02-11 DIAGNOSIS — Z124 Encounter for screening for malignant neoplasm of cervix: Secondary | ICD-10-CM

## 2023-03-01 ENCOUNTER — Telehealth: Payer: Self-pay | Admitting: *Deleted

## 2023-03-01 NOTE — Telephone Encounter (Signed)
Yesi,  This pt's BMI is greater than 50; their procedure will need to be performed at the hospital.  Thanks,  John Nulty 

## 2023-03-02 ENCOUNTER — Telehealth: Payer: Self-pay | Admitting: *Deleted

## 2023-03-02 NOTE — Telephone Encounter (Signed)
Not established with [facility] yet. Do you want office visit?

## 2023-03-03 ENCOUNTER — Other Ambulatory Visit (HOSPITAL_COMMUNITY): Payer: Self-pay

## 2023-03-03 MED ORDER — WEGOVY 0.5 MG/0.5ML ~~LOC~~ SOAJ
0.5000 mg | SUBCUTANEOUS | 0 refills | Status: DC
  Filled 2023-03-03: qty 2, 28d supply, fill #0

## 2023-03-04 NOTE — Telephone Encounter (Signed)
Spoke with the patient. Agrees to an appointment 03/11/23 at 9:30 am to discuss colonoscopy.

## 2023-03-09 ENCOUNTER — Other Ambulatory Visit (HOSPITAL_COMMUNITY): Payer: Self-pay

## 2023-03-11 ENCOUNTER — Encounter: Payer: Self-pay | Admitting: Physician Assistant

## 2023-03-11 ENCOUNTER — Ambulatory Visit: Payer: Medicaid Other | Admitting: Physician Assistant

## 2023-03-11 ENCOUNTER — Other Ambulatory Visit: Payer: Self-pay | Admitting: Physician Assistant

## 2023-03-11 VITALS — BP 120/60 | HR 80 | Ht 65.0 in | Wt 357.0 lb

## 2023-03-11 DIAGNOSIS — Z1211 Encounter for screening for malignant neoplasm of colon: Secondary | ICD-10-CM | POA: Diagnosis not present

## 2023-03-11 MED ORDER — CLENPIQ 10-3.5-12 MG-GM -GM/160ML PO SOLN: 1.0000 | ORAL | 0 refills | Status: DC

## 2023-03-11 NOTE — Progress Notes (Signed)
Subjective:    Patient ID: Diane Chen, female    DOB: [birth year], 51 y.o.   MRN: [id number]  HPI Diane Chen is a pleasant 52 year old African-American female, new to GI today referred by Diane Mattocks DO/[facility] for screening colonoscopy.  Patient has not had any prior GI evaluation. She currently has no active symptoms other than very mild constipation which has been a chronic problem.  She does not require any laxatives.  She has started on Wegovy about a month ago and has noticed some increase in constipation since starting that.  There is no complaints of abdominal pain, no melena or hematochezia. Family history negative for GI disease as far she is aware. Prior abdominal surgery bilateral tubal ligation. Other medical problems include hypertension, hyperlipidemia, chronic low back pain and morbid obesity with BMI of 59.  She has lost about 10 pounds since starting Wegovy.  Labs from June 2024 CBC within normal limits and TSH 3, be met unremarkable  Review of Systems Pertinent positive and negative review of systems were noted in the above HPI section.  All other review of systems was otherwise negative.   Outpatient Encounter Medications as of 03/11/2023  Medication Sig   cyclobenzaprine (FLEXERIL) 10 MG tablet TAKE ONE TABLET(10MG  TOTAL) BY MOUTH AT BEDTIME (Patient taking differently: Take 10 mg by mouth daily as needed for muscle spasms.)   hydrochlorothiazide (HYDRODIURIL) 25 MG tablet Take 1 tablet (25 mg total) by mouth daily.   HYDROcodone-acetaminophen (NORCO/VICODIN) 5-325 MG tablet Take 1 tablet by mouth 2 (two) times daily.   meloxicam (MOBIC) 7.5 MG tablet Take 1 tablet (7.5 mg total) by mouth daily. (Patient taking differently: Take 7.5 mg by mouth 2 (two) times daily.)   pregabalin (LYRICA) 75 MG capsule Take 75 mg by mouth 2 (two) times daily.   Semaglutide-Weight Management (WEGOVY) 0.5 MG/0.5ML SOAJ Inject 0.5 mg into the skin once a week.   [DISCONTINUED]  Sod Picosulfate-Mag Ox-Cit Acd (CLENPIQ) 10-3.5-12 MG-GM -GM/160ML SOLN Take 1 kit by mouth as directed.   No facility-administered encounter medications on file as of 03/11/2023.   Allergies  Allergen Reactions   Penicillins Other (See Comments)    Childhood allergy- reaction not recalled   Patient Active Problem List   Diagnosis Date Noted   Hyperlipidemia 01/05/2023   Chronic low back pain 03/20/2020   Muscle spasm 11/07/2019   Acute pain of left shoulder due to trauma 11/07/2019   Hypertension 08/07/2019   Morbid obesity (HCC) 09/02/2006   Social History   Socioeconomic History   Marital status: Legally Separated    Spouse name: Not on file   Number of children: 3   Years of education: Not on file   Highest education level: Not on file  Occupational History   Occupation: self empolyed  Tobacco Use   Smoking status: Never   Smokeless tobacco: Never  Vaping Use   Vaping status: Never Used  Substance and Sexual Activity   Alcohol use: No   Drug use: No   Sexual activity: Not on file  Other Topics Concern   Not on file  Social History Narrative   Not on file   Social Determinants of Health   Financial Resource Strain: Not on file  Food Insecurity: Not on file  Transportation Needs: Not on file  Physical Activity: Not on file  Stress: Not on file  Social Connections: Unknown (11/17/2021)   Received from [facility]   Social Network    Social  Network: Not on file  Intimate Partner Violence: Unknown (10/09/2021)   Received from [facility]   HITS    Physically Hurt: Not on file    Insult or Talk Down To: Not on file    Threaten Physical Harm: Not on file    Scream or Curse: Not on file    Diane Chen's family history is not on file.      Objective:    Vitals:   03/11/23 0911  BP: 120/60  Pulse: 80    Physical Exam Well-developed well-nourished African-American female in no acute distress.  Pleasant height, Weight, 357  BMI 59.4  HEENT; nontraumatic normocephalic, EOMI, PE R LA, sclera anicteric. Oropharynx; not examined today Neck; supple, no JVD Cardiovascular; regular rate and rhythm with S1-S2, no murmur rub or gallop Pulmonary; Clear bilaterally Abdomen; soft, morbidly obese, nontender, nondistended, no palpable mass or hepatosplenomegaly, bowel sounds are active Rectal; not done today Skin; benign exam, no jaundice rash or appreciable lesions Extremities; no clubbing cyanosis or edema skin warm and dry Neuro/Psych; alert and oriented x4, grossly nonfocal mood and affect appropriate        Assessment & Plan:   #30 52 year old African-American female referred for screening colonoscopy in setting of morbid obesity with BMI currently 59.4.  Average risk, asymptomatic and no family history  #2 hypertension #3.  Hyperlipidemia #4.  Chronic low back pain #5 mild constipation since starting Wegovy 1 month ago  Plan; Patient will be scheduled for colonoscopy with Diane Chen at the hospital due to high BMI.  Procedure was discussed in detail with the patient including indications risk and benefits and she is agreeable to proceed. Will start MiraLAX 17 g in 8 ounces of water every day now, also discussed increasing water intake to 50 to 60 ounces per day. Diane Chen will need to be held for 1 week prior to procedure.  Diane Chen S Diane Lauman PA-C 03/11/2023   Cc: Diane Mattocks, DO

## 2023-03-11 NOTE — Patient Instructions (Addendum)
_______________________________________________________  If your blood pressure at your visit was 140/90 or greater, please contact your primary care physician to follow up on this.  If you are age 53 or younger, your body mass index should be between 19-25. Your Body mass index is 59.41 kg/m. If this is out of the aformentioned range listed, please consider follow up with your Primary Care Provider.  _______________________________________________________  The [place] [facility] providers would like to encourage you to use [facility] to communicate with providers for non-urgent requests or questions.  Due to long hold times on the telephone, sending your provider a message by [facility] may be a faster and more efficient way to get a response.  Please allow 48 business hours for a response.  Please remember that this is for non-urgent requests.  _______________________________________________________  Please purchase the following medications over the counter and take as directed:  START: Miralax 17grams in 8 ounces of water daily.  Drink 50-60 ounces of water daily.  You have been scheduled for a colonoscopy. Please follow written instructions given to you at your visit today.   Please pick up your prep supplies at the pharmacy within the next 1-3 days.  If you use inhalers (even only as needed), please bring them with you on the day of your procedure.  DO NOT TAKE 7 DAYS PRIOR TO TEST- Ozempic, Wegovy (semaglutide) ___________________________________________________________________________  Due to recent changes in healthcare laws, you may see the results of your imaging and laboratory studies on MyChart before your provider has had a chance to review them.  We understand that in some cases there may be results that are confusing or concerning to you. Not all laboratory results come back in the same time frame and the provider may be waiting for multiple results in order to interpret others.  Please  give [place] 48 hours in order for your provider to thoroughly review all the results before contacting the office for clarification of your results.   Thank you for entrusting me with your care and choosing [facility].  Amy Esterwood, PA-C

## 2023-03-16 NOTE — Progress Notes (Signed)
Agree with the assessment and plan as outlined by Amy Esterwood, PA-C.  Vito Cirigliano, DO, FACG  

## 2023-04-05 ENCOUNTER — Other Ambulatory Visit (HOSPITAL_COMMUNITY): Payer: Self-pay

## 2023-04-05 MED ORDER — WEGOVY 1 MG/0.5ML ~~LOC~~ SOAJ
1.0000 mg | SUBCUTANEOUS | 0 refills | Status: DC
Start: 2023-04-05 — End: 2024-04-30
  Filled 2023-04-05: qty 2, 28d supply, fill #0

## 2023-04-06 ENCOUNTER — Ambulatory Visit: Payer: Medicaid Other | Admitting: Family Medicine

## 2023-04-09 ENCOUNTER — Encounter: Payer: Medicaid Other | Admitting: Internal Medicine

## 2023-05-19 ENCOUNTER — Telehealth: Payer: Self-pay | Admitting: Physician Assistant

## 2023-05-19 NOTE — Telephone Encounter (Signed)
Called the patient. Sample of Clenpiq at the front desk of the 2nd floor. Patient will come to pick it up or send someone to pick it up for her.

## 2023-05-19 NOTE — Telephone Encounter (Signed)
Patient called and stated her pharmacy told her that the medicine Clenpiq was not accepted by her insurance. She was wondering if there was an alternative. Patient also requested a call back. Please advise.

## 2023-05-27 ENCOUNTER — Other Ambulatory Visit: Payer: Self-pay | Admitting: Student

## 2023-05-27 DIAGNOSIS — I10 Essential (primary) hypertension: Secondary | ICD-10-CM

## 2023-05-31 ENCOUNTER — Encounter (HOSPITAL_COMMUNITY): Payer: Self-pay | Admitting: Gastroenterology

## 2023-06-08 ENCOUNTER — Encounter (HOSPITAL_COMMUNITY)
Admission: RE | Disposition: A | Discharge status: Home / Self Care | Payer: Self-pay | Source: Home / Self Care | Attending: Gastroenterology

## 2023-06-08 ENCOUNTER — Other Ambulatory Visit: Payer: Self-pay

## 2023-06-08 ENCOUNTER — Ambulatory Visit (HOSPITAL_COMMUNITY)
Admission: RE | Admit: 2023-06-08 | Discharge: 2023-06-08 | Disposition: A | Discharge status: Home / Self Care | Payer: Medicaid Other | Attending: Gastroenterology | Admitting: Gastroenterology

## 2023-06-08 ENCOUNTER — Encounter (HOSPITAL_COMMUNITY): Payer: Self-pay | Admitting: Gastroenterology

## 2023-06-08 ENCOUNTER — Ambulatory Visit (HOSPITAL_BASED_OUTPATIENT_CLINIC_OR_DEPARTMENT_OTHER): Payer: Medicaid Other | Admitting: Certified Registered Nurse Anesthetist

## 2023-06-08 ENCOUNTER — Ambulatory Visit (HOSPITAL_COMMUNITY): Payer: Medicaid Other | Admitting: Certified Registered Nurse Anesthetist

## 2023-06-08 DIAGNOSIS — I10 Essential (primary) hypertension: Secondary | ICD-10-CM | POA: Insufficient documentation

## 2023-06-08 DIAGNOSIS — K648 Other hemorrhoids: Secondary | ICD-10-CM | POA: Diagnosis not present

## 2023-06-08 DIAGNOSIS — Z1211 Encounter for screening for malignant neoplasm of colon: Secondary | ICD-10-CM

## 2023-06-08 DIAGNOSIS — M1990 Unspecified osteoarthritis, unspecified site: Secondary | ICD-10-CM | POA: Diagnosis not present

## 2023-06-08 DIAGNOSIS — D123 Benign neoplasm of transverse colon: Secondary | ICD-10-CM

## 2023-06-08 DIAGNOSIS — Z7985 Long-term (current) use of injectable non-insulin antidiabetic drugs: Secondary | ICD-10-CM | POA: Insufficient documentation

## 2023-06-08 HISTORY — PX: POLYPECTOMY: SHX5525

## 2023-06-08 HISTORY — PX: COLONOSCOPY WITH PROPOFOL: SHX5780

## 2023-06-08 SURGERY — COLONOSCOPY WITH PROPOFOL
Anesthesia: Monitor Anesthesia Care

## 2023-06-08 MED ORDER — METOCLOPRAMIDE HCL 5 MG/ML IJ SOLN
INTRAMUSCULAR | Status: DC | PRN
  Administered 2023-06-08: 10 mg via INTRAVENOUS

## 2023-06-08 MED ORDER — SODIUM CHLORIDE 0.9 % IV SOLN: INTRAVENOUS | Status: DC

## 2023-06-08 MED ORDER — DROPERIDOL 2.5 MG/ML IJ SOLN: 0.6250 mg | Freq: Once | INTRAMUSCULAR | Status: DC | PRN

## 2023-06-08 MED ORDER — OXYCODONE HCL 5 MG PO TABS: 5.0000 mg | ORAL_TABLET | Freq: Once | ORAL | Status: DC | PRN

## 2023-06-08 MED ORDER — PROPOFOL 500 MG/50ML IV EMUL
INTRAVENOUS | Status: DC | PRN
  Administered 2023-06-08: 100 ug/kg/min via INTRAVENOUS

## 2023-06-08 MED ORDER — DEXMEDETOMIDINE HCL IN NACL 80 MCG/20ML IV SOLN
INTRAVENOUS | Status: DC | PRN
  Administered 2023-06-08 (×2): 8 ug via INTRAVENOUS

## 2023-06-08 MED ORDER — ACETAMINOPHEN 10 MG/ML IV SOLN: 1000.0000 mg | Freq: Once | INTRAVENOUS | Status: DC | PRN

## 2023-06-08 MED ORDER — OXYCODONE HCL 5 MG/5ML PO SOLN: 5.0000 mg | Freq: Once | ORAL | Status: DC | PRN

## 2023-06-08 MED ORDER — PROPOFOL 10 MG/ML IV BOLUS
INTRAVENOUS | Status: DC | PRN
  Administered 2023-06-08: 50 mg via INTRAVENOUS
  Administered 2023-06-08: 10 mg via INTRAVENOUS

## 2023-06-08 MED ORDER — PHENYLEPHRINE 80 MCG/ML (10ML) SYRINGE FOR IV PUSH (FOR BLOOD PRESSURE SUPPORT)
PREFILLED_SYRINGE | INTRAVENOUS | Status: DC | PRN
  Administered 2023-06-08: 80 ug via INTRAVENOUS

## 2023-06-08 MED ORDER — ONDANSETRON HCL 4 MG/2ML IJ SOLN
INTRAMUSCULAR | Status: DC | PRN
  Administered 2023-06-08: 4 mg via INTRAVENOUS

## 2023-06-08 MED ORDER — FENTANYL CITRATE (PF) 100 MCG/2ML IJ SOLN: 25.0000 ug | INTRAMUSCULAR | Status: DC | PRN

## 2023-06-08 SURGICAL SUPPLY — 21 items
ELECT REM PT RETURN 9FT ADLT (ELECTROSURGICAL) IMPLANT
ELECTRODE REM PT RTRN 9FT ADLT (ELECTROSURGICAL) IMPLANT
FCP BXJMBJMB 240X2.8X (CUTTING FORCEPS)
FLOOR PAD 36X40 (MISCELLANEOUS) ×2 IMPLANT
FORCEPS BIOP RAD 4 LRG CAP 4 (CUTTING FORCEPS) IMPLANT
FORCEPS BIOP RJ4 240 W/NDL (CUTTING FORCEPS) IMPLANT
FORCEPS BXJMBJMB 240X2.8X (CUTTING FORCEPS) IMPLANT
INJECTOR/SNARE I SNARE (MISCELLANEOUS) IMPLANT
LUBRICANT JELLY 4.5OZ STERILE (MISCELLANEOUS) IMPLANT
MANIFOLD NEPTUNE II (INSTRUMENTS) IMPLANT
NDL SCLEROTHERAPY 25GX240 (NEEDLE) IMPLANT
NEEDLE SCLEROTHERAPY 25GX240 (NEEDLE) IMPLANT
PAD FLOOR 36X40 (MISCELLANEOUS) ×2 IMPLANT
PROBE APC STR FIRE (PROBE) IMPLANT
PROBE INJECTION GOLD 7FR (MISCELLANEOUS) IMPLANT
SNARE ROTATE MED OVAL 20MM (MISCELLANEOUS) IMPLANT
SYR 50ML LL SCALE MARK (SYRINGE) IMPLANT
TRAP SPECIMEN MUCOUS 40CC (MISCELLANEOUS) IMPLANT
TUBING ENDO SMARTCAP PENTAX (MISCELLANEOUS) IMPLANT
TUBING IRRIGATION ENDOGATOR (MISCELLANEOUS) ×2 IMPLANT
WATER STERILE IRR 1000ML POUR (IV SOLUTION) IMPLANT

## 2023-06-08 NOTE — Interval H&P Note (Signed)
History and Physical Interval Note:  06/08/2023 8:02 AM  Diane Chen  has presented today for surgery, with the diagnosis of Colon cancer screening,.  The various methods of treatment have been discussed with the patient and family. After consideration of risks, benefits and other options for treatment, the patient has consented to  Procedure(s): COLONOSCOPY WITH PROPOFOL (N/A) as a surgical intervention.  The patient's history has been reviewed, patient examined, no change in status, stable for surgery.  I have reviewed the patient's chart and labs.  Questions were answered to the patient's satisfaction.     Verlin Dike Bryn Saline

## 2023-06-08 NOTE — Anesthesia Postprocedure Evaluation (Signed)
Anesthesia Post Note  Patient: Diane Chen  Procedure(s) Performed: COLONOSCOPY WITH PROPOFOL POLYPECTOMY     Patient location during evaluation: PACU Anesthesia Type: MAC Level of consciousness: awake and alert Pain management: pain level controlled Vital Signs Assessment: post-procedure vital signs reviewed and stable Respiratory status: spontaneous breathing, nonlabored ventilation, respiratory function stable and patient connected to nasal cannula oxygen Cardiovascular status: stable and blood pressure returned to baseline Postop Assessment: no apparent nausea or vomiting Anesthetic complications: no   No notable events documented.  Last Vitals:  Vitals:   06/08/23 0950 06/08/23 1000  BP: (!) 100/59 108/80  Pulse: 86 76  Resp: (!) 23 15  Temp:    SpO2: 100% 97%    Last Pain:  Vitals:   06/08/23 0950  TempSrc:   PainSc: 0-No pain                 [place]

## 2023-06-08 NOTE — Op Note (Signed)
[facility] Patient Name: Diane Chen Procedure Date: 06/08/2023 MRN: [id number] Attending MD: Doristine Locks , MD, [id number] Date of Birth: 05/08/1971 CSN: [id number] Age: 52 Admit Type: Outpatient Procedure:                Colonoscopy Indications:              Screening for colorectal malignant neoplasm, This                            is the patient's first colonoscopy Providers:                Doristine Locks, MD, Fransisca Connors, Rozetta Nunnery, Technician Referring MD:              Medicines:                Monitored Anesthesia Care Complications:            No immediate complications. Estimated Blood Loss:     Estimated blood loss was minimal. Procedure:                Pre-Anesthesia Assessment:                           - Prior to the procedure, a History and Physical                            was performed, and patient medications and                            allergies were reviewed. The patient's tolerance of                            previous anesthesia was also reviewed. The risks                            and benefits of the procedure and the sedation                            options and risks were discussed with the patient.                            All questions were answered, and informed consent                            was obtained. Prior Anticoagulants: The patient has                            taken no anticoagulant or antiplatelet agents. ASA                            Grade Assessment: III - A patient with severe  systemic disease. After reviewing the risks and                            benefits, the patient was deemed in satisfactory                            condition to undergo the procedure.                           After obtaining informed consent, the colonoscope                            was passed under direct vision. Throughout the                             procedure, the patient's blood pressure, pulse, and                            oxygen saturations were monitored continuously. The                            CF-HQ190L ([id number]) Olympus colonoscope was                            introduced through the anus and advanced to the the                            cecum, identified by appendiceal orifice and                            ileocecal valve. The colonoscopy was performed                            without difficulty. The patient tolerated the                            procedure well. Following copious irrigation and                            lavage, the quality of the bowel preparation was                            good. The ileocecal valve, appendiceal orifice, and                            rectum were photographed. Scope In: 9:13:45 AM Scope Out: 9:36:50 AM Scope Withdrawal Time: 0 hours 16 minutes 1 second  Total Procedure Duration: 0 hours 23 minutes 5 seconds  Findings:      The perianal and digital rectal examinations were normal.      A 5 mm polyp was found in the transverse colon. The polyp was       mucous-capped and sessile. The polyp was removed with a cold snare.       Resection and retrieval were complete. Estimated blood loss was minimal.  A moderate amount of semi-liquid stool was found scattered throughout       the colon. Lavage of the area was performed using copious amounts of       sterile water, resulting in clearance with good visualization.      Non-bleeding internal hemorrhoids were found during retroflexion. The       hemorrhoids were small. Impression:               - One 5 mm polyp in the transverse colon, removed                            with a cold snare. Resected and retrieved.                           - Stool in the entire examined colon. This was                            lavaged with adequate visualization.                           - Non-bleeding internal hemorrhoids. Moderate Sedation:       Not Applicable - Patient had care per Anesthesia. Recommendation:           - Patient has a contact number available for                            emergencies. The signs and symptoms of potential                            delayed complications were discussed with the                            patient. Return to normal activities tomorrow.                            Written discharge instructions were provided to the                            patient.                           - Resume previous diet.                           - Continue present medications.                           - Await pathology results.                           - Repeat colonoscopy for surveillance based on                            pathology results.                           - Recommend extended 2-day prep with  next                            colonoscopy.                           - Return to GI clinic PRN. Procedure Code(s):        --- Professional ---                           [phone], Colonoscopy, flexible; with removal of                            tumor(s), polyp(s), or other lesion(s) by snare                            technique Diagnosis Code(s):        --- Professional ---                           Z12.11, Encounter for screening for malignant                            neoplasm of colon                           D12.3, Benign neoplasm of transverse colon (hepatic                            flexure or splenic flexure)                           K64.8, Other hemorrhoids CPT copyright 2022 American Medical Association. All rights reserved. The codes documented in this report are preliminary and upon coder review may  be revised to meet current compliance requirements. Doristine Locks, MD 06/08/2023 9:48:50 AM Number of Addenda: 0

## 2023-06-08 NOTE — Discharge Instructions (Signed)

## 2023-06-08 NOTE — H&P (Signed)
GASTROENTEROLOGY PROCEDURE H&P NOTE   Primary Care Physician: [name], DO    Reason for Procedure:  Colon cancer screening  Plan:    Colonoscopy   Patient is appropriate for endoscopic procedure(s) at [facility].  The nature of the procedure, as well as the risks, benefits, and alternatives were carefully and thoroughly reviewed with the patient. Ample time for discussion and questions allowed. The patient understood, was satisfied, and agreed to proceed.     HPI: Diane Chen is a 52 y.o. female who presents for colonoscopy for colon cancer screening.  No previous CRC screening.  Has been holding [place] >1 week in anticipation of procedure today.  Otherwise no significant changes in clinical history since last office appointment on 03/11/2023.  Procedure scheduled at [facility] due to elevated periprocedural risks from underlying comorbidities.  Past Medical History:  Diagnosis Date   Arthritis    High blood pressure    Hypertension    Obesity     Past Surgical History:  Procedure Laterality Date   TUBAL LIGATION      Prior to Admission medications   Medication Sig Start Date End Date Taking? Authorizing Provider  CLENPIQ 10-3.5-12 MG-GM -GM/160ML SOLN TAKE AS DIRECTED 03/11/23   Esterwood, Amy S, PA-C  cyclobenzaprine (FLEXERIL) 10 MG tablet TAKE ONE TABLET(10MG  TOTAL) BY MOUTH AT BEDTIME Patient taking differently: Take 10 mg by mouth daily as needed for muscle spasms. 11/22/19   Cathleen Corti, MD  hydrochlorothiazide (HYDRODIURIL) 25 MG tablet TAKE 1 TABLET(25 MG) BY MOUTH DAILY 05/28/23   [name], DO  HYDROcodone-acetaminophen (NORCO/VICODIN) 5-325 MG tablet Take 1 tablet by mouth 2 (two) times daily.    [provider]  meloxicam (MOBIC) 7.5 MG tablet Take 1 tablet (7.5 mg total) by mouth daily. Patient taking differently: Take 7.5 mg by mouth 2 (two) times daily. 11/16/19   Cathleen Corti, MD   pregabalin (LYRICA) 75 MG capsule Take 75 mg by mouth 2 (two) times daily.    [provider]  Semaglutide-Weight Management (WEGOVY) 0.5 MG/0.5ML SOAJ Inject 0.5 mg into the skin once a week. 03/03/23     Semaglutide-Weight Management (WEGOVY) 1 MG/0.5ML SOAJ Inject 1 mg into the skin once a week. 04/05/23       Current Facility-Administered Medications  Medication Dose Route Frequency Provider Last Rate Last Admin   0.9 %  sodium chloride infusion   Intravenous Continuous Esterwood, Amy S, PA-C        Allergies as of 03/11/2023 - Review Complete 03/11/2023  Allergen Reaction Noted   Penicillins Other (See Comments) 12/24/2022    Family History  Problem Relation Age of Onset   Liver disease Neg Hx    Esophageal cancer Neg Hx    Colon cancer Neg Hx     Social History   Socioeconomic History   Marital status: Legally Separated    Spouse name: Not on file   Number of children: 3   Years of education: Not on file   Highest education level: Not on file  Occupational History   Occupation: self empolyed  Tobacco Use   Smoking status: Never   Smokeless tobacco: Never  Vaping Use   Vaping status: Never Used  Substance and Sexual Activity   Alcohol use: No   Drug use: No   Sexual activity: Not on file  Other Topics Concern   Not on file  Social History Narrative   Not on file  Social Determinants of Health   Financial Resource Strain: Not on file  Food Insecurity: Not on file  Transportation Needs: Not on file  Physical Activity: Not on file  Stress: Not on file  Social Connections: Unknown (11/17/2021)   Received from [facility]   Social Network    Social Network: Not on file  Intimate Partner Violence: Unknown (10/09/2021)   Received from [facility], [facility]   HITS    Physically Hurt: Not on file    Insult or Talk Down To: Not on file    Threaten Physical Harm: Not on file    Scream or Curse: Not on file    Physical  Exam: Vital signs in last 24 hours: @Wt  (!) 154.2 kg   LMP  (LMP Unknown) Comment: pink tinged spotting 06/07/23, pre-menopausal, not sexually active, declined pregnancy test  BMI 56.58 kg/m  GEN: NAD EYE: Sclerae anicteric ENT: MMM CV: Non-tachycardic Pulm: CTA b/l GI: Soft, NT/ND NEURO:  Alert & Oriented x 3   Doristine Locks, DO [facility]   06/08/2023 8:01 AM

## 2023-06-08 NOTE — Anesthesia Procedure Notes (Signed)
Procedure Name: MAC Date/Time: 06/08/2023 9:06 AM  Performed by: Ludwig Lean, CRNAPre-anesthesia Checklist: Patient identified, Emergency Drugs available, Suction available and Patient being monitored Patient Re-evaluated:Patient Re-evaluated prior to induction Oxygen Delivery Method: Simple face mask Placement Confirmation: positive ETCO2 and breath sounds checked- equal and bilateral

## 2023-06-08 NOTE — Anesthesia Preprocedure Evaluation (Signed)
Anesthesia Evaluation  Patient identified by MRN, date of birth, ID band Patient awake    Reviewed: Allergy & Precautions, H&P , NPO status , Patient's Chart, lab work & pertinent test results  Airway Mallampati: II  TM Distance: >3 FB Neck ROM: Full    Dental no notable dental hx.    Pulmonary neg pulmonary ROS   Pulmonary exam normal breath sounds clear to auscultation       Cardiovascular hypertension, Normal cardiovascular exam Rhythm:Regular Rate:Normal     Neuro/Psych negative neurological ROS  negative psych ROS   GI/Hepatic negative GI ROS, Neg liver ROS,,,  Endo/Other  negative endocrine ROS    Renal/GU negative Renal ROS  negative genitourinary   Musculoskeletal  (+) Arthritis ,    Abdominal   Peds negative pediatric ROS (+)  Hematology negative hematology ROS (+)   Anesthesia Other Findings   Reproductive/Obstetrics negative OB ROS                              Anesthesia Physical Anesthesia Plan  ASA: 3  Anesthesia Plan: MAC   Post-op Pain Management:    Induction: Intravenous  PONV Risk Score and Plan: Propofol infusion and Treatment may vary due to age or medical condition  Airway Management Planned: Natural Airway  Additional Equipment:   Intra-op Plan:   Post-operative Plan:   Informed Consent: I have reviewed the patients History and Physical, chart, labs and discussed the procedure including the risks, benefits and alternatives for the proposed anesthesia with the patient or authorized representative who has indicated his/her understanding and acceptance.     Dental advisory given  Plan Discussed with: CRNA  Anesthesia Plan Comments:          Anesthesia Quick Evaluation

## 2023-06-08 NOTE — Transfer of Care (Signed)
Immediate Anesthesia Transfer of Care Note  Patient: Diane Chen  Procedure(s) Performed: Procedure(s): COLONOSCOPY WITH PROPOFOL (N/A) POLYPECTOMY  Patient Location: PACU  Anesthesia Type:MAC  Level of Consciousness: Patient comfortable, responds to stimulation.   Airway & Oxygen Therapy: Patient spontaneously breathing, ventilating well, oxygen via simple oxygen mask.  Post-op Assessment: Report given to PACU RN, vital signs reviewed and stable, moving all extremities.   Post vital signs: Reviewed and stable.  Complications: No apparent anesthesia complications Last Vitals:  Vitals Value Taken Time  BP 101/60 06/08/23 0947  Temp    Pulse 80 06/08/23 0947  Resp 15 06/08/23 0947  SpO2 100 % 06/08/23 0947  Vitals shown include unfiled device data.  Last Pain:  Vitals:   06/08/23 0822  TempSrc: Temporal  PainSc: 6          Complications: No notable events documented.

## 2023-06-09 LAB — SURGICAL PATHOLOGY

## 2023-06-11 ENCOUNTER — Other Ambulatory Visit (HOSPITAL_COMMUNITY): Payer: Self-pay

## 2023-06-14 ENCOUNTER — Encounter (HOSPITAL_COMMUNITY): Payer: Self-pay | Admitting: Gastroenterology

## 2023-06-14 ENCOUNTER — Other Ambulatory Visit (HOSPITAL_COMMUNITY): Payer: Self-pay

## 2023-06-14 MED ORDER — WEGOVY 1.7 MG/0.75ML ~~LOC~~ SOAJ
1.7000 mg | SUBCUTANEOUS | 1 refills | Status: DC
  Filled 2023-06-14: qty 3, 28d supply, fill #0
  Filled 2023-07-13: qty 3, 28d supply, fill #1

## 2023-06-16 ENCOUNTER — Other Ambulatory Visit (HOSPITAL_COMMUNITY): Payer: Self-pay

## 2023-06-16 MED ORDER — WEGOVY 1.7 MG/0.75ML ~~LOC~~ SOAJ
1.7000 mg | SUBCUTANEOUS | 1 refills | Status: DC
  Filled 2023-06-16: qty 3, 28d supply, fill #0

## 2023-07-13 ENCOUNTER — Other Ambulatory Visit (HOSPITAL_COMMUNITY): Payer: Self-pay

## 2023-07-29 ENCOUNTER — Other Ambulatory Visit (HOSPITAL_COMMUNITY): Payer: Self-pay

## 2023-07-29 MED ORDER — WEGOVY 2.4 MG/0.75ML ~~LOC~~ SOAJ
2.4000 mg | SUBCUTANEOUS | 2 refills | Status: DC
  Filled 2023-07-29: qty 3, 28d supply, fill #0
  Filled 2023-09-10: qty 3, 28d supply, fill #1

## 2023-08-30 ENCOUNTER — Other Ambulatory Visit (HOSPITAL_COMMUNITY): Payer: Self-pay

## 2023-08-30 MED ORDER — WEGOVY 2.4 MG/0.75ML ~~LOC~~ SOAJ
2.4000 mg | SUBCUTANEOUS | 2 refills | Status: DC
  Filled 2023-08-30: qty 3, 28d supply, fill #0

## 2023-08-30 MED ORDER — ONDANSETRON HCL 4 MG PO TABS
4.0000 mg | ORAL_TABLET | Freq: Two times a day (BID) | ORAL | 0 refills | Status: DC | PRN
  Filled 2023-08-30: qty 30, 15d supply, fill #0

## 2023-09-09 ENCOUNTER — Other Ambulatory Visit (HOSPITAL_COMMUNITY): Payer: Self-pay

## 2023-09-09 ENCOUNTER — Other Ambulatory Visit: Payer: Self-pay | Admitting: Neurosurgery

## 2023-09-09 DIAGNOSIS — M4316 Spondylolisthesis, lumbar region: Secondary | ICD-10-CM

## 2023-09-10 ENCOUNTER — Other Ambulatory Visit (HOSPITAL_COMMUNITY): Payer: Self-pay

## 2023-10-02 ENCOUNTER — Other Ambulatory Visit: Payer: Self-pay | Admitting: Neurosurgery

## 2023-10-02 DIAGNOSIS — M4316 Spondylolisthesis, lumbar region: Secondary | ICD-10-CM

## 2023-10-06 ENCOUNTER — Other Ambulatory Visit (HOSPITAL_COMMUNITY): Payer: Self-pay

## 2023-10-06 ENCOUNTER — Encounter: Payer: Self-pay | Admitting: Neurosurgery

## 2023-10-06 MED ORDER — WEGOVY 2.4 MG/0.75ML ~~LOC~~ SOAJ
2.4000 mg | SUBCUTANEOUS | 2 refills | Status: DC
  Filled 2023-10-06 – 2023-10-14 (×2): qty 3, 28d supply, fill #0

## 2023-10-14 ENCOUNTER — Ambulatory Visit
Admission: RE | Admit: 2023-10-14 | Discharge: 2023-10-14 | Disposition: A | Discharge status: Home / Self Care | Source: Ambulatory Visit | Attending: Neurosurgery | Admitting: Neurosurgery

## 2023-10-14 DIAGNOSIS — M4316 Spondylolisthesis, lumbar region: Secondary | ICD-10-CM

## 2023-10-15 ENCOUNTER — Other Ambulatory Visit (HOSPITAL_COMMUNITY): Payer: Self-pay

## 2023-11-10 ENCOUNTER — Other Ambulatory Visit (HOSPITAL_COMMUNITY): Payer: Self-pay

## 2023-11-10 MED ORDER — ZEPBOUND 10 MG/0.5ML ~~LOC~~ SOAJ
10.0000 mg | SUBCUTANEOUS | 1 refills | Status: DC
  Filled 2023-11-10 – 2023-11-17 (×3): qty 2, 28d supply, fill #0
  Filled 2023-12-11: qty 2, 28d supply, fill #1

## 2023-11-11 ENCOUNTER — Other Ambulatory Visit (HOSPITAL_COMMUNITY): Payer: Self-pay

## 2023-11-16 ENCOUNTER — Other Ambulatory Visit (HOSPITAL_COMMUNITY): Payer: Self-pay

## 2023-11-17 ENCOUNTER — Other Ambulatory Visit (HOSPITAL_COMMUNITY): Payer: Self-pay

## 2023-11-18 ENCOUNTER — Other Ambulatory Visit (HOSPITAL_COMMUNITY): Payer: Self-pay

## 2023-12-13 ENCOUNTER — Other Ambulatory Visit (HOSPITAL_COMMUNITY): Payer: Self-pay

## 2023-12-14 ENCOUNTER — Encounter: Payer: Self-pay | Admitting: *Deleted

## 2024-01-13 ENCOUNTER — Other Ambulatory Visit (HOSPITAL_COMMUNITY): Payer: Self-pay

## 2024-01-13 MED ORDER — ZEPBOUND 10 MG/0.5ML ~~LOC~~ SOAJ
10.0000 mg | SUBCUTANEOUS | 1 refills | Status: DC
  Filled 2024-01-13: qty 2, 28d supply, fill #0

## 2024-01-17 ENCOUNTER — Other Ambulatory Visit (HOSPITAL_COMMUNITY): Payer: Self-pay

## 2024-01-17 MED ORDER — ZEPBOUND 10 MG/0.5ML ~~LOC~~ SOAJ
10.0000 mg | SUBCUTANEOUS | 1 refills | Status: DC
  Filled 2024-01-17: qty 2, 28d supply, fill #0

## 2024-02-17 ENCOUNTER — Other Ambulatory Visit (HOSPITAL_COMMUNITY): Payer: Self-pay

## 2024-02-17 MED ORDER — ZEPBOUND 12.5 MG/0.5ML ~~LOC~~ SOAJ
12.5000 mg | SUBCUTANEOUS | 1 refills | Status: DC
  Filled 2024-02-17: qty 2, 28d supply, fill #0

## 2024-02-25 ENCOUNTER — Other Ambulatory Visit (HOSPITAL_COMMUNITY): Payer: Self-pay

## 2024-03-28 ENCOUNTER — Other Ambulatory Visit (HOSPITAL_COMMUNITY): Payer: Self-pay

## 2024-03-28 MED ORDER — ZEPBOUND 12.5 MG/0.5ML ~~LOC~~ SOAJ
12.5000 mg | SUBCUTANEOUS | 1 refills | Status: DC
  Filled 2024-03-28 – 2024-04-10 (×2): qty 2, 28d supply, fill #0

## 2024-04-04 ENCOUNTER — Other Ambulatory Visit: Payer: Self-pay

## 2024-04-04 DIAGNOSIS — I10 Essential (primary) hypertension: Secondary | ICD-10-CM

## 2024-04-04 MED ORDER — HYDROCHLOROTHIAZIDE 25 MG PO TABS: 25.0000 mg | ORAL_TABLET | Freq: Every day | ORAL | 3 refills | Status: AC

## 2024-04-06 ENCOUNTER — Other Ambulatory Visit (HOSPITAL_COMMUNITY): Payer: Self-pay

## 2024-04-10 ENCOUNTER — Other Ambulatory Visit (HOSPITAL_COMMUNITY): Payer: Self-pay

## 2024-04-24 ENCOUNTER — Other Ambulatory Visit (HOSPITAL_COMMUNITY): Payer: Self-pay

## 2024-04-24 MED ORDER — PHENTERMINE-TOPIRAMATE ER 7.5-46 MG PO CP24
1.0000 | ORAL_CAPSULE | Freq: Every day | ORAL | 1 refills | Status: AC
  Filled 2024-04-24: qty 30, 30d supply, fill #0

## 2024-04-30 ENCOUNTER — Inpatient Hospital Stay (HOSPITAL_COMMUNITY)
Admission: RE | Admit: 2024-04-30 | Discharge: 2024-04-30 | Payer: Self-pay | Attending: Emergency Medicine | Admitting: Emergency Medicine

## 2024-04-30 ENCOUNTER — Encounter (HOSPITAL_COMMUNITY): Payer: Self-pay

## 2024-04-30 VITALS — BP 152/92 | HR 60 | Temp 97.9°F | Resp 16

## 2024-04-30 DIAGNOSIS — R519 Headache, unspecified: Secondary | ICD-10-CM | POA: Diagnosis not present

## 2024-04-30 DIAGNOSIS — B349 Viral infection, unspecified: Secondary | ICD-10-CM | POA: Diagnosis not present

## 2024-04-30 DIAGNOSIS — J029 Acute pharyngitis, unspecified: Secondary | ICD-10-CM

## 2024-04-30 DIAGNOSIS — R110 Nausea: Secondary | ICD-10-CM | POA: Diagnosis not present

## 2024-04-30 LAB — POC COVID19/FLU A&B COMBO
Covid Antigen, POC: NEGATIVE
Influenza A Antigen, POC: NEGATIVE
Influenza B Antigen, POC: NEGATIVE

## 2024-04-30 LAB — POCT RAPID STREP A (OFFICE): Rapid Strep A Screen: NEGATIVE

## 2024-04-30 MED ORDER — ONDANSETRON 4 MG PO TBDP: 4.0000 mg | ORAL_TABLET | Freq: Three times a day (TID) | ORAL | 0 refills | Status: AC | PRN

## 2024-04-30 MED ORDER — AZELASTINE HCL 0.1 % NA SOLN: 2.0000 | Freq: Two times a day (BID) | NASAL | 0 refills | Status: AC

## 2024-04-30 MED ORDER — ONDANSETRON 4 MG PO TBDP
4.0000 mg | ORAL_TABLET | Freq: Once | ORAL | Status: AC
  Administered 2024-04-30: 4 mg via ORAL

## 2024-04-30 MED ORDER — ONDANSETRON 4 MG PO TBDP
ORAL_TABLET | ORAL | Status: AC
  Filled 2024-04-30: qty 1

## 2024-04-30 MED ORDER — DEXAMETHASONE SOD PHOSPHATE PF 10 MG/ML IJ SOLN
10.0000 mg | Freq: Once | INTRAMUSCULAR | Status: AC
  Administered 2024-04-30: 10 mg via INTRAMUSCULAR

## 2024-04-30 NOTE — ED Provider Notes (Signed)
 [facility]    CSN: [id number] Arrival date & time: 04/30/24  1210      History   Chief Complaint Chief Complaint  Patient presents with   Facial Pain    Throat bothers me, right side of face back to my ear is hurting. I was around my granddaughter prior to her getting strep throat last weekend. I woke up not feeling well today. - Entered by patient    HPI Diane Chen is a 53 y.o. female.   Patient presents with sore throat, bilateral ear pain, sinus pressure with throbbing headache, fatigue, and nausea that began 2 days ago.  Patient also has photophobia with headache.  Patient states that her symptoms have worsened over the last 2 days.  Patient states that she was recently exposed to her granddaughter who had strep throat and wonders if this is related to her symptoms.  Patient denies any fever, body aches, chills, weakness, chest pain, shortness of breath, vomiting, diarrhea, and abdominal pain.  The history is provided by the patient and medical records.    Past Medical History:  Diagnosis Date   Arthritis    High blood pressure    Hypertension    Obesity     Patient Active Problem List   Diagnosis Date Noted   Colon cancer screening 06/08/2023   Adenomatous polyp of transverse colon 06/08/2023   Hyperlipidemia 01/05/2023   Chronic low back pain 03/20/2020   Muscle spasm 11/07/2019   Acute pain of left shoulder due to trauma 11/07/2019   Hypertension 08/07/2019   Morbid obesity (HCC) 09/02/2006    Past Surgical History:  Procedure Laterality Date   COLONOSCOPY WITH PROPOFOL  N/A 06/08/2023   Procedure: COLONOSCOPY WITH PROPOFOL ;  Surgeon: San Sandor GAILS, DO;  Location: [facility];  Service: Gastroenterology;  Laterality: N/A;   POLYPECTOMY  06/08/2023   Procedure: POLYPECTOMY;  Surgeon: San Sandor GAILS, DO;  Location: [facility];  Service: Gastroenterology;;   TUBAL LIGATION      OB History   No obstetric history on file.       Home Medications    Prior to Admission medications   Medication Sig Start Date End Date Taking? Authorizing Provider  azelastine (ASTELIN) 0.1 % nasal spray Place 2 sprays into both nostrils 2 (two) times daily. Use in each nostril as directed 04/30/24  Yes Johnie Flaming A, NP  ondansetron  (ZOFRAN -ODT) 4 MG disintegrating tablet Take 1 tablet (4 mg total) by mouth every 8 (eight) hours as needed for nausea or vomiting. 04/30/24  Yes Johnie, Laker Thompson A, NP  cyclobenzaprine  (FLEXERIL ) 10 MG tablet TAKE ONE TABLET(10MG  TOTAL) BY MOUTH AT BEDTIME Patient taking differently: Take 10 mg by mouth daily as needed for muscle spasms. 11/22/19   Patel, Poonamkumari J, MD  hydrochlorothiazide  (HYDRODIURIL ) 25 MG tablet Take 1 tablet (25 mg total) by mouth daily. 04/04/24   Gomes, Adriana, DO  HYDROcodone-acetaminophen  (NORCO/VICODIN) 5-325 MG tablet Take 1 tablet by mouth 2 (two) times daily.    [provider]  meloxicam  (MOBIC ) 7.5 MG tablet Take 1 tablet (7.5 mg total) by mouth daily. Patient taking differently: Take 7.5 mg by mouth 2 (two) times daily. 11/16/19   Patel, Poonamkumari J, MD  Phentermine-Topiramate ER (QSYMIA) 7.5-46 MG CP24 Take 1 capsule by mouth daily. 04/24/24       Family History Family History  Problem Relation Age of Onset   Liver disease Neg Hx    Esophageal cancer Neg Hx    Colon cancer  Neg Hx     Social History Social History   Tobacco Use   Smoking status: Never   Smokeless tobacco: Never  Vaping Use   Vaping status: Never Used  Substance Use Topics   Alcohol use: No   Drug use: No     Allergies   Penicillins   Review of Systems Review of Systems  Per HPI  Physical Exam Triage Vital Signs ED Triage Vitals  Encounter Vitals Group     BP 04/30/24 1237 (!) 152/92     Girls Systolic BP Percentile --      Girls Diastolic BP Percentile --      Boys Systolic BP Percentile --      Boys Diastolic BP Percentile --      Pulse Rate 04/30/24  1237 60     Resp 04/30/24 1237 16     Temp 04/30/24 1237 97.9 F (36.6 C)     Temp Source 04/30/24 1237 Oral     SpO2 04/30/24 1237 98 %     Weight --      Height --      Head Circumference --      Peak Flow --      Pain Score 04/30/24 1236 8     Pain Loc --      Pain Education --      Exclude from Growth Chart --    No data found.  Updated Vital Signs BP (!) 152/92 (BP Location: Left Arm)   Pulse 60   Temp 97.9 F (36.6 C) (Oral)   Resp 16   LMP  (LMP Unknown)   SpO2 98%   Visual Acuity Right Eye Distance:   Left Eye Distance:   Bilateral Distance:    Right Eye Near:   Left Eye Near:    Bilateral Near:     Physical Exam Vitals and nursing note reviewed.  Constitutional:      General: She is awake. She is not in acute distress.    Appearance: Normal appearance. She is well-developed and well-groomed. She is not ill-appearing.  HENT:     Right Ear: Tympanic membrane, ear canal and external ear normal.     Left Ear: Tympanic membrane, ear canal and external ear normal.     Nose: Congestion and rhinorrhea present.     Right Sinus: Frontal sinus tenderness present.     Left Sinus: Frontal sinus tenderness present.     Mouth/Throat:     Mouth: Mucous membranes are moist.     Pharynx: Posterior oropharyngeal erythema and postnasal drip present. No oropharyngeal exudate.  Eyes:     Extraocular Movements: Extraocular movements intact.     Pupils: Pupils are equal, round, and reactive to light.  Cardiovascular:     Rate and Rhythm: Normal rate and regular rhythm.  Pulmonary:     Effort: Pulmonary effort is normal.     Breath sounds: Normal breath sounds.  Skin:    General: Skin is warm and dry.  Neurological:     General: No focal deficit present.     Mental Status: She is alert and oriented to person, place, and time. Mental status is at baseline.     GCS: GCS eye subscore is 4. GCS verbal subscore is 5. GCS motor subscore is 6.  Psychiatric:        Behavior:  Behavior is cooperative.      UC Treatments / Results  Labs (all labs ordered are listed, but only abnormal results  are displayed) Labs Reviewed  POCT RAPID STREP A (OFFICE)  POC COVID19/FLU A&B COMBO    EKG   Radiology No results found.  Procedures Procedures (including critical care time)  Medications Ordered in UC Medications  dexamethasone (DECADRON) injection 10 mg (10 mg Intramuscular Given 04/30/24 1314)  ondansetron  (ZOFRAN -ODT) disintegrating tablet 4 mg (4 mg Oral Given 04/30/24 1314)    Initial Impression / Assessment and Plan / UC Course  I have reviewed the triage vital signs and the nursing notes.  Pertinent labs & imaging results that were available during my care of the patient were reviewed by me and considered in my medical decision making (see chart for details).     Patient is overall well-appearing.  Vitals are stable.  Rapid strep, COVID, and flu testing all negative in clinic today.  Symptoms likely viral in nature.  Given IM Decadron in clinic for sinus pressure/headache.  Given ODT Zofran  for acute nausea.  Prescribed azelastine nasal spray for nasal congestion.  Prescribed additional Zofran  as needed for nausea.  Discussed over-the-counter medications as needed for symptoms.  Discussed follow-up, return, and strict ER precautions. Final Clinical Impressions(s) / UC Diagnoses   Final diagnoses:  Sore throat  Sinus headache  Nausea without vomiting  Viral syndrome     Discharge Instructions      Your strep, COVID, and flu testing were all negative today.  I believe your symptoms are likely related to a viral illness. You were given an injection of Decadron in clinic today to help with sinus pressure/headache. You can use azelastine nasal spray twice daily to help with congestion and sinus pressure. You can also take over-the-counter Coricidin to help with congestion and any cough.  This medication is safe with your history of high blood  pressure. You are also given a dose of Zofran  in clinic today to help with your nausea.  I prescribed a few additional pills of Zofran  as needed for nausea or vomiting.  You can take these every 8 hours as needed. You can take 500 to 1000 mg of Tylenol  every 6-8 hours as needed for headache or sore throat as well. Follow-up with your primary care provider or return here as needed. If you develop worsening headache, excessive vomiting, high fevers unrelieved by medication, weakness, or passing out please seek immediate medical treatment in the emergency department.     ED Prescriptions     Medication Sig Dispense Auth. Provider   ondansetron  (ZOFRAN -ODT) 4 MG disintegrating tablet Take 1 tablet (4 mg total) by mouth every 8 (eight) hours as needed for nausea or vomiting. 10 tablet Johnie Flaming A, NP   azelastine (ASTELIN) 0.1 % nasal spray Place 2 sprays into both nostrils 2 (two) times daily. Use in each nostril as directed 30 mL Johnie Flaming A, NP      PDMP not reviewed this encounter.   Johnie Flaming A, NP 04/30/24 1340

## 2024-04-30 NOTE — Discharge Instructions (Addendum)
 Your strep, COVID, and flu testing were all negative today.  I believe your symptoms are likely related to a viral illness. You were given an injection of Decadron in clinic today to help with sinus pressure/headache. You can use azelastine nasal spray twice daily to help with congestion and sinus pressure. You can also take over-the-counter Coricidin to help with congestion and any cough.  This medication is safe with your history of high blood pressure. You are also given a dose of Zofran  in clinic today to help with your nausea.  I prescribed a few additional pills of Zofran  as needed for nausea or vomiting.  You can take these every 8 hours as needed. You can take 500 to 1000 mg of Tylenol  every 6-8 hours as needed for headache or sore throat as well. Follow-up with your primary care provider or return here as needed. If you develop worsening headache, excessive vomiting, high fevers unrelieved by medication, weakness, or passing out please seek immediate medical treatment in the emergency department.

## 2024-04-30 NOTE — ED Triage Notes (Signed)
 Patient here today with c/o ST, ear pain, sinus pressure, fatigue, nausea, and throbbing headache X 2 days. Patient states that her granddaughter has strep.

## 2024-05-01 ENCOUNTER — Other Ambulatory Visit (HOSPITAL_COMMUNITY): Payer: Self-pay

## 2024-05-09 ENCOUNTER — Other Ambulatory Visit (HOSPITAL_COMMUNITY): Payer: Self-pay

## 2024-05-09 MED ORDER — PHENTERMINE-TOPIRAMATE ER 7.5-46 MG PO CP24
1.0000 | ORAL_CAPSULE | Freq: Every day | ORAL | 1 refills | Status: AC
  Filled 2024-05-09: qty 30, 30d supply, fill #0

## 2024-07-19 ENCOUNTER — Other Ambulatory Visit (HOSPITAL_COMMUNITY): Payer: Self-pay

## 2024-07-19 MED ORDER — ZEPBOUND 5 MG/0.5ML ~~LOC~~ SOAJ
5.0000 mg | SUBCUTANEOUS | 0 refills | Status: AC
  Filled 2024-07-19: qty 2, 28d supply, fill #0

## 2024-07-20 ENCOUNTER — Other Ambulatory Visit (HOSPITAL_COMMUNITY): Payer: Self-pay

## 2024-07-21 ENCOUNTER — Other Ambulatory Visit (HOSPITAL_COMMUNITY): Payer: Self-pay
